# Patient Record
Sex: Female | Born: 1960 | Race: White | Hispanic: No | Marital: Married | Smoking: Never smoker
Health system: Southern US, Community
[De-identification: ages and names within clinical notes are randomized; demographics above are authoritative.]

## PROBLEM LIST (undated history)

## (undated) DIAGNOSIS — M1990 Unspecified osteoarthritis, unspecified site: Secondary | ICD-10-CM

## (undated) DIAGNOSIS — M797 Fibromyalgia: Secondary | ICD-10-CM

## (undated) DIAGNOSIS — I719 Aortic aneurysm of unspecified site, without rupture: Secondary | ICD-10-CM

## (undated) DIAGNOSIS — K3184 Gastroparesis: Secondary | ICD-10-CM

## (undated) DIAGNOSIS — E8849 Other mitochondrial metabolism disorders: Secondary | ICD-10-CM

## (undated) DIAGNOSIS — E119 Type 2 diabetes mellitus without complications: Secondary | ICD-10-CM

## (undated) DIAGNOSIS — R519 Headache, unspecified: Secondary | ICD-10-CM

## (undated) DIAGNOSIS — J449 Chronic obstructive pulmonary disease, unspecified: Secondary | ICD-10-CM

## (undated) DIAGNOSIS — I10 Essential (primary) hypertension: Secondary | ICD-10-CM

## (undated) DIAGNOSIS — G7000 Myasthenia gravis without (acute) exacerbation: Secondary | ICD-10-CM

## (undated) DIAGNOSIS — E079 Disorder of thyroid, unspecified: Secondary | ICD-10-CM

## (undated) DIAGNOSIS — T8859XA Other complications of anesthesia, initial encounter: Secondary | ICD-10-CM

## (undated) DIAGNOSIS — E039 Hypothyroidism, unspecified: Secondary | ICD-10-CM

## (undated) DIAGNOSIS — G4730 Sleep apnea, unspecified: Secondary | ICD-10-CM

## (undated) DIAGNOSIS — E559 Vitamin D deficiency, unspecified: Secondary | ICD-10-CM

## (undated) DIAGNOSIS — J45909 Unspecified asthma, uncomplicated: Secondary | ICD-10-CM

## (undated) DIAGNOSIS — D649 Anemia, unspecified: Secondary | ICD-10-CM

## (undated) HISTORY — PX: TONSILLECTOMY: SUR1361

## (undated) HISTORY — PX: ABDOMINAL HYSTERECTOMY: SHX81

## (undated) HISTORY — PX: APPENDECTOMY: SHX54

## (undated) HISTORY — PX: SPINE SURGERY: SHX786

## (undated) HISTORY — PX: CHOLECYSTECTOMY: SHX55

## (undated) HISTORY — PX: CARDIAC CATHETERIZATION: SHX172

---

## 2014-06-10 ENCOUNTER — Ambulatory Visit: Payer: Self-pay | Admitting: Internal Medicine

## 2015-05-06 ENCOUNTER — Other Ambulatory Visit: Payer: Self-pay | Admitting: Internal Medicine

## 2015-05-06 DIAGNOSIS — Z1231 Encounter for screening mammogram for malignant neoplasm of breast: Secondary | ICD-10-CM

## 2015-06-11 ENCOUNTER — Ambulatory Visit: Payer: Self-pay

## 2015-09-17 ENCOUNTER — Emergency Department
Admission: EM | Admit: 2015-09-17 | Discharge: 2015-09-17 | Disposition: A | Discharge status: Home / Self Care | Payer: Medicare Other | Attending: Emergency Medicine | Admitting: Emergency Medicine

## 2015-09-17 ENCOUNTER — Encounter: Payer: Self-pay | Admitting: Emergency Medicine

## 2015-09-17 DIAGNOSIS — I10 Essential (primary) hypertension: Secondary | ICD-10-CM | POA: Insufficient documentation

## 2015-09-17 DIAGNOSIS — R112 Nausea with vomiting, unspecified: Secondary | ICD-10-CM | POA: Diagnosis present

## 2015-09-17 DIAGNOSIS — J45909 Unspecified asthma, uncomplicated: Secondary | ICD-10-CM | POA: Diagnosis not present

## 2015-09-17 DIAGNOSIS — M1990 Unspecified osteoarthritis, unspecified site: Secondary | ICD-10-CM | POA: Diagnosis not present

## 2015-09-17 DIAGNOSIS — K3184 Gastroparesis: Secondary | ICD-10-CM

## 2015-09-17 HISTORY — DX: Essential (primary) hypertension: I10

## 2015-09-17 HISTORY — DX: Fibromyalgia: M79.7

## 2015-09-17 HISTORY — DX: Gastroparesis: K31.84

## 2015-09-17 HISTORY — DX: Sleep apnea, unspecified: G47.30

## 2015-09-17 HISTORY — DX: Unspecified osteoarthritis, unspecified site: M19.90

## 2015-09-17 HISTORY — DX: Unspecified asthma, uncomplicated: J45.909

## 2015-09-17 HISTORY — DX: Disorder of thyroid, unspecified: E07.9

## 2015-09-17 LAB — COMPREHENSIVE METABOLIC PANEL
ALT: 16 U/L (ref 14–54)
ANION GAP: 11 (ref 5–15)
AST: 32 U/L (ref 15–41)
Albumin: 4.5 g/dL (ref 3.5–5.0)
Alkaline Phosphatase: 79 U/L (ref 38–126)
BUN: 9 mg/dL (ref 6–20)
CHLORIDE: 102 mmol/L (ref 101–111)
CO2: 27 mmol/L (ref 22–32)
Calcium: 9.7 mg/dL (ref 8.9–10.3)
Creatinine, Ser: 1.08 mg/dL — ABNORMAL HIGH (ref 0.44–1.00)
GFR, EST NON AFRICAN AMERICAN: 56 mL/min — AB (ref >60)
Glucose, Bld: 108 mg/dL — ABNORMAL HIGH (ref 65–99)
POTASSIUM: 4 mmol/L (ref 3.5–5.1)
Sodium: 140 mmol/L (ref 135–145)
Total Bilirubin: 0.5 mg/dL (ref 0.3–1.2)
Total Protein: 7.3 g/dL (ref 6.5–8.1)

## 2015-09-17 LAB — TROPONIN I

## 2015-09-17 LAB — CBC
HCT: 38 % (ref 35.0–47.0)
Hemoglobin: 12.7 g/dL (ref 12.0–16.0)
MCH: 31.2 pg (ref 26.0–34.0)
MCHC: 33.4 g/dL (ref 32.0–36.0)
MCV: 93.3 fL (ref 80.0–100.0)
PLATELETS: 230 10*3/uL (ref 150–440)
RBC: 4.07 MIL/uL (ref 3.80–5.20)
RDW: 13.5 % (ref 11.5–14.5)
WBC: 8.2 10*3/uL (ref 3.6–11.0)

## 2015-09-17 LAB — LIPASE, BLOOD: LIPASE: 26 U/L (ref 11–51)

## 2015-09-17 MED ORDER — SODIUM CHLORIDE 0.9 % IV SOLN
1000.0000 mL | Freq: Once | INTRAVENOUS | Status: AC
  Administered 2015-09-17: 1000 mL via INTRAVENOUS

## 2015-09-17 MED ORDER — SODIUM CHLORIDE 0.9 % IV SOLN
8.0000 mg | Freq: Once | INTRAVENOUS | Status: AC
Start: 2015-09-17 — End: 2015-09-17
  Administered 2015-09-17: 8 mg via INTRAVENOUS
  Filled 2015-09-17: qty 4

## 2015-09-17 MED ORDER — GI COCKTAIL ~~LOC~~
30.0000 mL | Freq: Once | ORAL | Status: AC
  Administered 2015-09-17: 30 mL via ORAL
  Filled 2015-09-17: qty 30

## 2015-09-17 NOTE — ED Provider Notes (Signed)
[facility] Emergency Department Provider Note  ____________________________________________    I have reviewed the triage vital signs and the nursing notes.   HISTORY  Chief Complaint Abdominal Pain and Nausea    HPI Betty Peterson is a 55 y.o. female who presents with complaints of abdominal pain in the epigastrium which is cramping and burning in nature as well as nausea and vomiting. She reports she is not tolerating by mouth's. She reports this is consistent with a gastroparesis flare for her. She has a long history of gastroparesis and takes Compazine, Reglan, Zofran and Phenergan and recently her insurance company stopped paying for Phenergan which she feels is responsible for her flare     Past Medical History  Diagnosis Date  . Gastroparesis   . Arthritis   . Asthma   . Fibromyalgia   . Thyroid disease   . Hypertension   . Sleep apnea     There are no active problems to display for this patient.   Past Surgical History  Procedure Laterality Date  . Appendectomy    . Abdominal hysterectomy    . Cholecystectomy    . Tonsillectomy      No current outpatient prescriptions on file.  Allergies Ambien; Avelox; Butorphanol tartrate; Carafate; Morphine and related; Prednisone; Simvastatin; Tape; Tetracyclines & related; and Biaxin  No family history on file.  Social History Social History  Substance Use Topics  . Smoking status: Never Smoker   . Smokeless tobacco: None  . Alcohol Use: No    Review of Systems  Constitutional: Negative for fever. Eyes: Negative for redness ENT: Negative for sore throat Cardiovascular: Negative for chest pain Respiratory: No cough Gastrointestinal: As above Genitourinary: Negative for dysuria. Musculoskeletal: Negative for back pain. Skin: Negative for rash. Neurological: Negative for focal weakness Psychiatric: no anxiety    ____________________________________________   PHYSICAL  EXAM:  VITAL SIGNS: ED Triage Vitals  Enc Vitals Group     BP 09/17/15 1505 157/102 mmHg     Pulse Rate 09/17/15 1505 92     Resp 09/17/15 1505 18     Temp 09/17/15 1505 98.6 F (37 C)     Temp Source 09/17/15 1505 Oral     SpO2 09/17/15 1505 95 %     Weight 09/17/15 1505 209 lb (94.802 kg)     Height 09/17/15 1505 5\' 7"  (1.702 m)     Head Cir --      Peak Flow --      Pain Score 09/17/15 1516 10     Pain Loc --      Pain Edu? --      Excl. in [place]? --      Constitutional: Alert and oriented. Well appearing and in no distress.  Eyes: Conjunctivae are normal. No erythema or injection ENT   Head: Normocephalic and atraumatic.   Mouth/Throat: Mucous membranes are moist. Cardiovascular: Normal rate, regular rhythm. Normal and symmetric distal pulses are present in the upper extremities Respiratory: Normal respiratory effort without tachypnea nor retractions. Breath sounds are clear and equal bilaterally.  Gastrointestinal: Mild tenderness in epigastrium. No distention. There is no CVA tenderness. Genitourinary: deferred Musculoskeletal: Nontender with normal range of motion in all extremities. No lower extremity tenderness nor edema. Neurologic:  Normal speech and language. No gross focal neurologic deficits are appreciated. Skin:  Skin is warm, dry and intact. No rash noted. Psychiatric: Mood and affect are normal. Patient exhibits appropriate insight and judgment.  ____________________________________________    LABS (pertinent  positives/negatives)  Labs Reviewed  COMPREHENSIVE METABOLIC PANEL - Abnormal; Notable for the following:    Glucose, Bld 108 (*)    Creatinine, Ser 1.08 (*)    GFR calc non Af Amer 56 (*)    All other components within normal limits  CBC  LIPASE, BLOOD  TROPONIN I    ____________________________________________   EKG  ED ECG REPORT I, Lavonia Drafts, the attending physician, personally viewed and interpreted this ECG.  Date:  09/17/2015 EKG Time: 4:05 PM Rate: 78 Rhythm: normal sinus rhythm QRS Axis: normal Intervals: Prolonged QT interval ST/T Wave abnormalities: normal Conduction Disturbances: none    ____________________________________________    RADIOLOGY  None  ____________________________________________   PROCEDURES  Procedure(s) performed: none  Critical Care performed: none  ____________________________________________   INITIAL IMPRESSION / ASSESSMENT AND PLAN / ED COURSE  Pertinent labs & imaging results that were available during my care of the patient were reviewed by me and considered in my medical decision making (see chart for details).   Patient persist with nausea and vomiting with a history of gastroparesis. She reports this feels exactly like a gastroparesis flare she has had in the past. She states typically IV fluids and Zofran helps her feel better. We will treat with a milligrams of IV Zofran, normal saline and reevaluate   ----------------------------------------- 6:01 PM on 09/17/2015 -----------------------------------------  Patient feeling much better, complaining of mild burning in her esophagus/pharynx we will give GI cocktail and discharge with pcp f/u  ____________________________________________   FINAL CLINICAL IMPRESSION(S) / ED DIAGNOSES  Final diagnoses:  Nondiabetic gastroparesis          Lavonia Drafts, MD 09/17/15 [phone]

## 2015-09-17 NOTE — ED Notes (Signed)
Pt presents to ED with abdominal pain and nausea. Pt states was recently treated with levaquin for a sinus infection and iron supplement which she thought was causing the nausea so she has stopped those but that has not helped. Pt states has other medications that she takes nausea but her insurance company will not cover the phenergan anymore so she has not been able to take it. Pt states she is on compazine, reglan and zofran. Pt states she has end stage gastroparesis.

## 2015-09-19 ENCOUNTER — Observation Stay
Admission: EM | Admit: 2015-09-19 | Discharge: 2015-09-22 | Disposition: A | Discharge status: Home / Self Care | Payer: Medicare Other | Attending: Internal Medicine | Admitting: Internal Medicine

## 2015-09-19 ENCOUNTER — Encounter: Payer: Self-pay | Admitting: Internal Medicine

## 2015-09-19 DIAGNOSIS — J45909 Unspecified asthma, uncomplicated: Secondary | ICD-10-CM | POA: Diagnosis not present

## 2015-09-19 DIAGNOSIS — Z885 Allergy status to narcotic agent status: Secondary | ICD-10-CM | POA: Diagnosis not present

## 2015-09-19 DIAGNOSIS — Z9049 Acquired absence of other specified parts of digestive tract: Secondary | ICD-10-CM | POA: Insufficient documentation

## 2015-09-19 DIAGNOSIS — J329 Chronic sinusitis, unspecified: Secondary | ICD-10-CM | POA: Diagnosis not present

## 2015-09-19 DIAGNOSIS — G4730 Sleep apnea, unspecified: Secondary | ICD-10-CM | POA: Insufficient documentation

## 2015-09-19 DIAGNOSIS — M1990 Unspecified osteoarthritis, unspecified site: Secondary | ICD-10-CM | POA: Insufficient documentation

## 2015-09-19 DIAGNOSIS — R1110 Vomiting, unspecified: Secondary | ICD-10-CM

## 2015-09-19 DIAGNOSIS — I10 Essential (primary) hypertension: Secondary | ICD-10-CM | POA: Insufficient documentation

## 2015-09-19 DIAGNOSIS — Z833 Family history of diabetes mellitus: Secondary | ICD-10-CM | POA: Insufficient documentation

## 2015-09-19 DIAGNOSIS — Z8249 Family history of ischemic heart disease and other diseases of the circulatory system: Secondary | ICD-10-CM | POA: Insufficient documentation

## 2015-09-19 DIAGNOSIS — Z881 Allergy status to other antibiotic agents status: Secondary | ICD-10-CM | POA: Diagnosis not present

## 2015-09-19 DIAGNOSIS — E039 Hypothyroidism, unspecified: Secondary | ICD-10-CM | POA: Insufficient documentation

## 2015-09-19 DIAGNOSIS — Z90710 Acquired absence of both cervix and uterus: Secondary | ICD-10-CM | POA: Insufficient documentation

## 2015-09-19 DIAGNOSIS — E785 Hyperlipidemia, unspecified: Secondary | ICD-10-CM | POA: Diagnosis not present

## 2015-09-19 DIAGNOSIS — Z91048 Other nonmedicinal substance allergy status: Secondary | ICD-10-CM | POA: Insufficient documentation

## 2015-09-19 DIAGNOSIS — K3184 Gastroparesis: Principal | ICD-10-CM | POA: Insufficient documentation

## 2015-09-19 DIAGNOSIS — Z79899 Other long term (current) drug therapy: Secondary | ICD-10-CM | POA: Insufficient documentation

## 2015-09-19 DIAGNOSIS — M797 Fibromyalgia: Secondary | ICD-10-CM | POA: Diagnosis not present

## 2015-09-19 DIAGNOSIS — Z888 Allergy status to other drugs, medicaments and biological substances status: Secondary | ICD-10-CM | POA: Insufficient documentation

## 2015-09-19 LAB — TROPONIN I

## 2015-09-19 LAB — COMPREHENSIVE METABOLIC PANEL
ALK PHOS: 79 U/L (ref 38–126)
ALT: 23 U/L (ref 14–54)
AST: 77 U/L — ABNORMAL HIGH (ref 15–41)
Albumin: 4.4 g/dL (ref 3.5–5.0)
Anion gap: 11 (ref 5–15)
BUN: 12 mg/dL (ref 6–20)
CALCIUM: 9.8 mg/dL (ref 8.9–10.3)
CO2: 25 mmol/L (ref 22–32)
CREATININE: 1.01 mg/dL — AB (ref 0.44–1.00)
Chloride: 101 mmol/L (ref 101–111)
Glucose, Bld: 101 mg/dL — ABNORMAL HIGH (ref 65–99)
Potassium: 4 mmol/L (ref 3.5–5.1)
Sodium: 137 mmol/L (ref 135–145)
Total Bilirubin: 0.3 mg/dL (ref 0.3–1.2)
Total Protein: 7.2 g/dL (ref 6.5–8.1)

## 2015-09-19 LAB — CBC
HCT: 37.9 % (ref 35.0–47.0)
HEMOGLOBIN: 12.8 g/dL (ref 12.0–16.0)
MCH: 31.5 pg (ref 26.0–34.0)
MCHC: 33.8 g/dL (ref 32.0–36.0)
MCV: 93.1 fL (ref 80.0–100.0)
Platelets: 234 10*3/uL (ref 150–440)
RBC: 4.07 MIL/uL (ref 3.80–5.20)
RDW: 12.8 % (ref 11.5–14.5)
WBC: 10.8 10*3/uL (ref 3.6–11.0)

## 2015-09-19 LAB — GLUCOSE, CAPILLARY
Glucose-Capillary: 84 mg/dL (ref 65–99)
Glucose-Capillary: 137 mg/dL — ABNORMAL HIGH (ref 65–99)

## 2015-09-19 LAB — LIPASE, BLOOD: LIPASE: 24 U/L (ref 11–51)

## 2015-09-19 MED ORDER — SODIUM CHLORIDE 0.9 % IV SOLN
1000.0000 mL | Freq: Once | INTRAVENOUS | Status: AC
  Administered 2015-09-19: 1000 mL via INTRAVENOUS

## 2015-09-19 MED ORDER — MAGNESIUM OXIDE 400 (241.3 MG) MG PO TABS
400.0000 mg | ORAL_TABLET | Freq: Three times a day (TID) | ORAL | Status: DC
  Administered 2015-09-19 – 2015-09-22 (×8): 400 mg via ORAL
  Filled 2015-09-19 (×8): qty 1

## 2015-09-19 MED ORDER — OXYCODONE-ACETAMINOPHEN 5-325 MG PO TABS
1.0000 | ORAL_TABLET | Freq: Four times a day (QID) | ORAL | Status: DC | PRN
  Administered 2015-09-19 – 2015-09-21 (×4): 1 via ORAL
  Filled 2015-09-19 (×4): qty 1

## 2015-09-19 MED ORDER — METOCLOPRAMIDE HCL 5 MG/ML IJ SOLN
10.0000 mg | Freq: Once | INTRAMUSCULAR | Status: AC
  Administered 2015-09-19: 10 mg via INTRAVENOUS
  Filled 2015-09-19: qty 2

## 2015-09-19 MED ORDER — SODIUM CHLORIDE 0.9 % IV SOLN
INTRAVENOUS | Status: DC
  Administered 2015-09-19 – 2015-09-20 (×2): via INTRAVENOUS

## 2015-09-19 MED ORDER — BISACODYL 5 MG PO TBEC
15.0000 mg | DELAYED_RELEASE_TABLET | Freq: Every day | ORAL | Status: DC
  Administered 2015-09-19 – 2015-09-21 (×3): 15 mg via ORAL
  Filled 2015-09-19 (×4): qty 3

## 2015-09-19 MED ORDER — OMEGA-3-ACID ETHYL ESTERS 1 G PO CAPS
1000.0000 mg | ORAL_CAPSULE | Freq: Three times a day (TID) | ORAL | Status: DC
  Administered 2015-09-19 – 2015-09-22 (×8): 1000 mg via ORAL
  Filled 2015-09-19 (×8): qty 1

## 2015-09-19 MED ORDER — TIZANIDINE HCL 4 MG PO TABS
6.0000 mg | ORAL_TABLET | Freq: Three times a day (TID) | ORAL | Status: DC
  Administered 2015-09-19 – 2015-09-22 (×8): 6 mg via ORAL
  Filled 2015-09-19 (×8): qty 2

## 2015-09-19 MED ORDER — POTASSIUM CHLORIDE CRYS ER 10 MEQ PO TBCR
10.0000 meq | EXTENDED_RELEASE_TABLET | Freq: Two times a day (BID) | ORAL | Status: DC
  Administered 2015-09-19 – 2015-09-22 (×6): 10 meq via ORAL
  Filled 2015-09-19 (×10): qty 1

## 2015-09-19 MED ORDER — TRAMADOL HCL 50 MG PO TABS
50.0000 mg | ORAL_TABLET | ORAL | Status: DC | PRN
  Administered 2015-09-19 – 2015-09-22 (×5): 50 mg via ORAL
  Filled 2015-09-19 (×5): qty 1

## 2015-09-19 MED ORDER — POLYSACCHARIDE IRON COMPLEX 150 MG PO CAPS
150.0000 mg | ORAL_CAPSULE | Freq: Every day | ORAL | Status: DC
  Administered 2015-09-20: 150 mg via ORAL
  Filled 2015-09-19 (×2): qty 1

## 2015-09-19 MED ORDER — DIPHENHYDRAMINE HCL 25 MG PO TABS
50.0000 mg | ORAL_TABLET | ORAL | Status: DC | PRN
  Filled 2015-09-19: qty 2

## 2015-09-19 MED ORDER — DIPHENHYDRAMINE HCL 25 MG PO CAPS
25.0000 mg | ORAL_CAPSULE | Freq: Four times a day (QID) | ORAL | Status: DC
  Administered 2015-09-19 – 2015-09-20 (×6): 25 mg via ORAL
  Filled 2015-09-19 (×13): qty 1

## 2015-09-19 MED ORDER — PRAVASTATIN SODIUM 20 MG PO TABS
20.0000 mg | ORAL_TABLET | Freq: Every day | ORAL | Status: DC
  Administered 2015-09-19 – 2015-09-21 (×3): 20 mg via ORAL
  Filled 2015-09-19 (×3): qty 1

## 2015-09-19 MED ORDER — LORATADINE 10 MG PO TABS
10.0000 mg | ORAL_TABLET | Freq: Every day | ORAL | Status: DC
  Administered 2015-09-19 – 2015-09-22 (×4): 10 mg via ORAL
  Filled 2015-09-19 (×4): qty 1

## 2015-09-19 MED ORDER — BISOPROLOL FUMARATE 10 MG PO TABS
10.0000 mg | ORAL_TABLET | Freq: Every day | ORAL | Status: DC
  Administered 2015-09-19 – 2015-09-22 (×4): 10 mg via ORAL
  Filled 2015-09-19 (×4): qty 1

## 2015-09-19 MED ORDER — ONDANSETRON HCL 4 MG/2ML IJ SOLN
INTRAMUSCULAR | Status: AC
  Administered 2015-09-19: 4 mg via INTRAVENOUS
  Filled 2015-09-19: qty 2

## 2015-09-19 MED ORDER — VITAMIN B-12 1000 MCG PO TABS
1000.0000 ug | ORAL_TABLET | Freq: Every day | ORAL | Status: DC
  Administered 2015-09-20 – 2015-09-22 (×3): 1000 ug via ORAL
  Filled 2015-09-19 (×3): qty 1

## 2015-09-19 MED ORDER — PANTOPRAZOLE SODIUM 40 MG PO TBEC
40.0000 mg | DELAYED_RELEASE_TABLET | Freq: Every day | ORAL | Status: DC
  Administered 2015-09-19 – 2015-09-22 (×4): 40 mg via ORAL
  Filled 2015-09-19 (×4): qty 1

## 2015-09-19 MED ORDER — HEPARIN SODIUM (PORCINE) 5000 UNIT/ML IJ SOLN
5000.0000 [IU] | Freq: Three times a day (TID) | INTRAMUSCULAR | Status: DC
  Administered 2015-09-19 – 2015-09-20 (×2): 5000 [IU] via SUBCUTANEOUS
  Filled 2015-09-19 (×2): qty 1

## 2015-09-19 MED ORDER — POLYETHYLENE GLYCOL 3350 17 G PO PACK
17.0000 g | PACK | Freq: Every day | ORAL | Status: DC
  Administered 2015-09-19 – 2015-09-21 (×3): 17 g via ORAL
  Filled 2015-09-19 (×3): qty 1

## 2015-09-19 MED ORDER — METOCLOPRAMIDE HCL 5 MG/ML IJ SOLN
INTRAMUSCULAR | Status: AC
  Administered 2015-09-19: 10 mg via INTRAVENOUS
  Filled 2015-09-19: qty 2

## 2015-09-19 MED ORDER — INSULIN ASPART 100 UNIT/ML ~~LOC~~ SOLN: 0.0000 [IU] | Freq: Three times a day (TID) | SUBCUTANEOUS | Status: DC

## 2015-09-19 MED ORDER — FOLIC ACID 1 MG PO TABS
1.0000 mg | ORAL_TABLET | Freq: Every day | ORAL | Status: DC
  Administered 2015-09-20 – 2015-09-22 (×3): 1 mg via ORAL
  Filled 2015-09-19 (×3): qty 1

## 2015-09-19 MED ORDER — ONDANSETRON HCL 4 MG/2ML IJ SOLN
4.0000 mg | Freq: Four times a day (QID) | INTRAMUSCULAR | Status: DC | PRN
  Administered 2015-09-19 – 2015-09-20 (×3): 4 mg via INTRAVENOUS
  Filled 2015-09-19 (×2): qty 2

## 2015-09-19 MED ORDER — COQ10 200 MG PO CAPS: 200.0000 mg | ORAL_CAPSULE | Freq: Every day | ORAL | Status: DC

## 2015-09-19 MED ORDER — LEVOTHYROXINE SODIUM 150 MCG PO TABS
150.0000 ug | ORAL_TABLET | Freq: Every day | ORAL | Status: DC
  Administered 2015-09-20 – 2015-09-22 (×3): 150 ug via ORAL
  Filled 2015-09-19 (×3): qty 1

## 2015-09-19 MED ORDER — MIRTAZAPINE 15 MG PO TABS
30.0000 mg | ORAL_TABLET | Freq: Every day | ORAL | Status: DC
  Filled 2015-09-19: qty 2

## 2015-09-19 MED ORDER — FLUTICASONE PROPIONATE 50 MCG/ACT NA SUSP
2.0000 | Freq: Every day | NASAL | Status: DC
  Administered 2015-09-19 – 2015-09-21 (×3): 2 via NASAL
  Filled 2015-09-19 (×2): qty 16

## 2015-09-19 MED ORDER — VITAMIN D 1000 UNITS PO TABS
1000.0000 [IU] | ORAL_TABLET | Freq: Every day | ORAL | Status: DC
  Administered 2015-09-20 – 2015-09-22 (×3): 1000 [IU] via ORAL
  Filled 2015-09-19 (×3): qty 1

## 2015-09-19 MED ORDER — METOCLOPRAMIDE HCL 10 MG PO TABS
10.0000 mg | ORAL_TABLET | Freq: Four times a day (QID) | ORAL | Status: DC
  Administered 2015-09-19 – 2015-09-21 (×3): 10 mg via ORAL
  Filled 2015-09-19 (×4): qty 1

## 2015-09-19 MED ORDER — LOSARTAN POTASSIUM 50 MG PO TABS
100.0000 mg | ORAL_TABLET | Freq: Every day | ORAL | Status: DC
  Administered 2015-09-19 – 2015-09-22 (×4): 100 mg via ORAL
  Filled 2015-09-19 (×4): qty 2

## 2015-09-19 MED ORDER — ACETAMINOPHEN 500 MG PO TABS: 500.0000 mg | ORAL_TABLET | ORAL | Status: DC | PRN

## 2015-09-19 MED ORDER — METOCLOPRAMIDE HCL 5 MG/ML IJ SOLN
10.0000 mg | Freq: Three times a day (TID) | INTRAMUSCULAR | Status: DC | PRN
  Administered 2015-09-19: 10 mg via INTRAVENOUS

## 2015-09-19 MED ORDER — MELATONIN 5 MG PO TABS
10.0000 mg | ORAL_TABLET | Freq: Every day | ORAL | Status: DC
  Administered 2015-09-19 – 2015-09-21 (×3): 10 mg via ORAL
  Filled 2015-09-19 (×3): qty 2

## 2015-09-19 MED ORDER — LEVOCETIRIZINE DIHYDROCHLORIDE 5 MG PO TABS: 5.0000 mg | ORAL_TABLET | Freq: Every evening | ORAL | Status: DC

## 2015-09-19 MED ORDER — ADULT MULTIVITAMIN W/MINERALS CH
1.0000 | ORAL_TABLET | Freq: Every day | ORAL | Status: DC
  Administered 2015-09-20 – 2015-09-22 (×3): 1 via ORAL
  Filled 2015-09-19 (×3): qty 1

## 2015-09-19 MED ORDER — BUMETANIDE 1 MG PO TABS
1.0000 mg | ORAL_TABLET | Freq: Every day | ORAL | Status: DC
  Administered 2015-09-20 – 2015-09-22 (×3): 1 mg via ORAL
  Filled 2015-09-19 (×5): qty 1

## 2015-09-19 MED ORDER — ONDANSETRON HCL 4 MG/2ML IJ SOLN
4.0000 mg | Freq: Once | INTRAMUSCULAR | Status: AC
  Administered 2015-09-19: 4 mg via INTRAVENOUS
  Filled 2015-09-19: qty 2

## 2015-09-19 MED ORDER — IBUPROFEN 800 MG PO TABS
800.0000 mg | ORAL_TABLET | Freq: Three times a day (TID) | ORAL | Status: DC | PRN
  Administered 2015-09-20: 800 mg via ORAL
  Filled 2015-09-19 (×2): qty 1

## 2015-09-19 MED ORDER — OXYCODONE-ACETAMINOPHEN 5-325 MG PO TABS
ORAL_TABLET | ORAL | Status: AC
  Administered 2015-09-19: 1 via ORAL
  Filled 2015-09-19: qty 1

## 2015-09-19 MED ORDER — LEVOFLOXACIN 500 MG PO TABS
500.0000 mg | ORAL_TABLET | Freq: Every day | ORAL | Status: DC
  Administered 2015-09-20 – 2015-09-22 (×3): 500 mg via ORAL
  Filled 2015-09-19 (×3): qty 1

## 2015-09-19 NOTE — ED Notes (Signed)
Seen Wednesday for gastroparesis treated for nausea. Pt states she started vomiting this morning multiple times pt also c/o upper abd pain. Has had 2 loose stools today takes miralax

## 2015-09-19 NOTE — ED Notes (Signed)
Pt states usually takes a "cocktail" of GI meds: zofran, phenergan, compazine, and reglan and that her insurance has recently made the compazine and phenergan too expensive.

## 2015-09-19 NOTE — ED Provider Notes (Signed)
[facility] Emergency Department Provider Note  ____________________________________________    I have reviewed the triage vital signs and the nursing notes.   HISTORY  Chief Complaint Abdominal Pain and Emesis    HPI Betty Peterson is a 55 y.o. female who presents with complaints of epigastric, pain, and nausea and vomiting. Patient was seen by me yesterday and had felt immediately better after treatment with IV Zofran and fluids. She reports she began vomiting again this morning. She has a long history of gastroparesis and typically takes Compazine Reglan Zofran and Phenergan daily. She denies chest pain. No shortness of breath. No fevers or chills.     Past Medical History  Diagnosis Date  . Gastroparesis   . Arthritis   . Asthma   . Fibromyalgia   . Thyroid disease   . Hypertension   . Sleep apnea     There are no active problems to display for this patient.   Past Surgical History  Procedure Laterality Date  . Appendectomy    . Abdominal hysterectomy    . Cholecystectomy    . Tonsillectomy      No current outpatient prescriptions on file.  Allergies Ambien; Avelox; Butorphanol tartrate; Carafate; Morphine and related; Prednisone; Simvastatin; Tape; Tetracyclines & related; and Biaxin  No family history on file.  Social History Social History  Substance Use Topics  . Smoking status: Never Smoker   . Smokeless tobacco: Not on file  . Alcohol Use: No    Review of Systems  Constitutional: Negative for fever.   Cardiovascular: Negative for chest pain Respiratory: Negative for shortness of breath. Gastrointestinal: As above Genitourinary: Negative for dysuria.  Skin: Negative for rash. Neurological: Negative for focal weakness Psychiatric: no anxiety  10 point review of systems otherwise negative  ____________________________________________   PHYSICAL EXAM:  VITAL SIGNS: ED Triage Vitals  Enc Vitals Group      BP 09/19/15 1321 180/96 mmHg     Pulse Rate 09/19/15 1321 88     Resp 09/19/15 1321 20     Temp 09/19/15 1321 98.6 F (37 C)     Temp Source 09/19/15 1321 Oral     SpO2 09/19/15 1321 98 %     Weight 09/19/15 1321 207 lb (93.895 kg)     Height 09/19/15 1321 5\' 7"  (1.702 m)     Head Cir --      Peak Flow --      Pain Score 09/19/15 1322 10     Pain Loc --      Pain Edu? --      Excl. in [place]? --      Constitutional: Alert and oriented. Well appearing and in no distress.  Eyes: Conjunctivae are normal. No erythema or injection ENT   Head: Normocephalic and atraumatic.   Mouth/Throat: Mucous membranes are moist. Cardiovascular: Normal rate, regular rhythm. Normal and symmetric distal pulses are present in the upper extremities.  Respiratory: Normal respiratory effort without tachypnea nor retractions. Breath sounds are clear and equal bilaterally.  Gastrointestinal: Mild tenderness in the epigastrium. No distention. There is no CVA tenderness. Genitourinary: deferred Musculoskeletal: Nontender with normal range of motion in all extremities. No lower extremity tenderness nor edema. Neurologic:  Normal speech and language. No gross focal neurologic deficits are appreciated. Skin:  Skin is warm, dry and intact. No rash noted. Psychiatric: Mood and affect are normal. Patient exhibits appropriate insight and judgment.  ____________________________________________    LABS (pertinent positives/negatives)  Labs Reviewed  CBC  COMPREHENSIVE METABOLIC PANEL  LIPASE, BLOOD    ____________________________________________   EKG  ED ECG REPORT I, Lavonia Drafts, the attending physician, personally viewed and interpreted this ECG.  Date: 09/19/2015 EKG Time: 1:25 PM Rate: 76 Rhythm: normal sinus rhythm QRS Axis: normal Intervals: normal ST/T Wave abnormalities: He wave inversions V3 V4 Conduction Disturbances: none    ____________________________________________     RADIOLOGY  None  ____________________________________________   PROCEDURES  Procedure(s) performed: none  Critical Care performed: none  ____________________________________________   INITIAL IMPRESSION / ASSESSMENT AND PLAN / ED COURSE  Pertinent labs & imaging results that were available during my care of the patient were reviewed by me and considered in my medical decision making (see chart for details).  Patient presents with symptoms of gastroparesis flare. We attempt at home treatment yesterday but we were unsuccessful. Patient says she does well with both Reglan and Zofran IV so we will try those medications today with a liter of normal saline and reevaluate.  Patient continues to feel ill and vomit. We will admit to the hospital for further management  ____________________________________________   FINAL CLINICAL IMPRESSION(S) / ED DIAGNOSES  Final diagnoses:  Intractable vomiting with nausea, vomiting of unspecified type          Lavonia Drafts, MD 09/19/15 1630

## 2015-09-19 NOTE — H&P (Signed)
[facility] [place] NAME: Betty Peterson    MR#:  [id number]  DATE OF BIRTH:  May 14, 1960  DATE OF ADMISSION:  09/19/2015  PRIMARY CARE PHYSICIAN: Tracie Harrier, MD   REQUESTING/REFERRING PHYSICIAN: [place]  CHIEF COMPLAINT:   Chief Complaint  Patient presents with  . Abdominal Pain  . Emesis    HISTORY OF PRESENT ILLNESS: Betty Peterson  is a 55 y.o. female with a known history of Gastroperesis, hypertension, Sleep apnea, Fibromyalgia- had vomiting episodes for 2 days- and oral meds are not helping- came to ER- given IV inj- felt better- sent home. Again starte vomiting, and abd pain, so came back. She could not tolerate oral diet for her oral medications for last 2 days.  PAST MEDICAL HISTORY:   Past Medical History  Diagnosis Date  . Gastroparesis   . Arthritis   . Asthma   . Fibromyalgia   . Thyroid disease   . Hypertension   . Sleep apnea     PAST SURGICAL HISTORY:  Past Surgical History  Procedure Laterality Date  . Appendectomy    . Abdominal hysterectomy    . Cholecystectomy    . Tonsillectomy      SOCIAL HISTORY:  Social History  Substance Use Topics  . Smoking status: Never Smoker   . Smokeless tobacco: Not on file  . Alcohol Use: No    FAMILY HISTORY:  Family History  Problem Relation Age of Onset  . Diabetes Mother   . Hypertension Mother   . CAD Mother   . Hypertension Father   . CAD Father   . CAD Brother     DRUG ALLERGIES:  Allergies  Allergen Reactions  . Ambien [Zolpidem Tartrate] Other (See Comments)    hallucinations  . Avelox [Moxifloxacin Hcl In Nacl] Hives  . 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  . Butorphanol Tartrate Other (See Comments)  . Carafate [Sucralfate] Hives  . Morphine And Related Nausea And Vomiting  . Prednisone Nausea And Vomiting  . Simvastatin Other (See Comments)    Muscle pain  . Tape Other (See  Comments)    blisters  . Tetracyclines & Related Nausea And Vomiting  . Biaxin [Clarithromycin] Palpitations    REVIEW OF SYSTEMS:   CONSTITUTIONAL: No fever, fatigue or weakness.  EYES: No blurred or double vision.  EARS, NOSE, AND THROAT: No tinnitus or ear pain.  RESPIRATORY: No cough, shortness of breath, wheezing or hemoptysis.  CARDIOVASCULAR: No chest pain, orthopnea, edema.  GASTROINTESTINAL: positive   for nausea and vomiting with some abdominal pain, no diarrhea. GENITOURINARY: No dysuria, hematuria.  ENDOCRINE: No polyuria, nocturia,  HEMATOLOGY: No anemia, easy bruising or bleeding SKIN: No rash or lesion. MUSCULOSKELETAL: No joint pain or arthritis.   NEUROLOGIC: No tingling, numbness, weakness.  PSYCHIATRY: No anxiety or depression.   MEDICATIONS AT HOME:  Prior to Admission medications   Medication Sig Start Date End Date Taking? Authorizing Provider  levocetirizine (XYZAL) 5 MG tablet Take 5 mg by mouth every evening.   Yes Historical Provider, MD      PHYSICAL EXAMINATION:   VITAL SIGNS: Blood pressure 176/95, pulse 84, temperature 98.6 F (37 C), temperature source Oral, resp. rate 16, height 5\' 7"  (1.702 m), weight 93.895 kg (207 lb), SpO2 95 %.  GENERAL:  55 y.o.-year-old patient lying in the bed with no acute distress.  EYES: Pupils equal, round, reactive to light and accommodation. No scleral  icterus. Extraocular muscles intact.  HEENT: Head atraumatic, normocephalic. Oropharynx and nasopharynx clear.  NECK:  Supple, no jugular venous distention. No thyroid enlargement, no tenderness.  LUNGS: Normal breath sounds bilaterally, no wheezing, rales,rhonchi or crepitation. No use of accessory muscles of respiration.  CARDIOVASCULAR: S1, S2 normal. No murmurs, rubs, or gallops.  ABDOMEN: Soft, Mild epigastric tender, nondistended. Bowel sounds present. No organomegaly or mass.  EXTREMITIES: No pedal edema, cyanosis, or clubbing.  NEUROLOGIC: Cranial nerves II  through XII are intact. Muscle strength 5/5 in all extremities. Sensation intact. Gait not checked.  PSYCHIATRIC: The patient is alert and oriented x 3.  SKIN: No obvious rash, lesion, or ulcer.   LABORATORY PANEL:   CBC  Recent Labs Lab 09/17/15 1606 09/19/15 1400  WBC 8.2 10.8  HGB 12.7 12.8  HCT 38.0 37.9  PLT 230 234  MCV 93.3 93.1  MCH 31.2 31.5  MCHC 33.4 33.8  RDW 13.5 12.8   ------------------------------------------------------------------------------------------------------------------  Chemistries   Recent Labs Lab 09/17/15 1606 09/19/15 1400  NA 140 137  K 4.0 4.0  CL 102 101  CO2 27 25  GLUCOSE 108* 101*  BUN 9 12  CREATININE 1.08* 1.01*  CALCIUM 9.7 9.8  AST 32 77*  ALT 16 23  ALKPHOS 79 79  BILITOT 0.5 0.3   ------------------------------------------------------------------------------------------------------------------ estimated creatinine clearance is 73.1 mL/min (by C-G formula based on Cr of 1.01). ------------------------------------------------------------------------------------------------------------------ No results for input(s): TSH, T4TOTAL, T3FREE, THYROIDAB in the last 72 hours.  Invalid input(s): FREET3   Coagulation profile No results for input(s): INR, PROTIME in the last 168 hours. ------------------------------------------------------------------------------------------------------------------- No results for input(s): DDIMER in the last 72 hours. -------------------------------------------------------------------------------------------------------------------  Cardiac Enzymes  Recent Labs Lab 09/17/15 1606 09/19/15 1400  TROPONINI <0.03 <0.03   ------------------------------------------------------------------------------------------------------------------ Invalid input(s): POCBNP  ---------------------------------------------------------------------------------------------------------------  Urinalysis No  results found for: COLORURINE, APPEARANCEUR, LABSPEC, PHURINE, GLUCOSEU, HGBUR, BILIRUBINUR, KETONESUR, PROTEINUR, UROBILINOGEN, NITRITE, LEUKOCYTESUR   RADIOLOGY: No results found.  EKG: Orders placed or performed during the hospital encounter of 09/19/15  . EKG 12-Lead  . EKG 12-Lead    IMPRESSION AND PLAN:  * Intractable nausea and vomiting.   She had extensive workup done at [facility] in 1994 and she was diagnosed with gastroparesis.   Since then she is on multiple oral medications for nausea and help with gastric motility.   I will keep her on liquid diet and IV fluids, and give her IV Phenergan and Zofran.   As per patient, this episode is very similar to her gastroparesis episodes and there are no other symptoms, so I would hold on further workup at this point.  * Hypertension   Blood pressure is uncontrolled currently, as she could not take her meds for last 2 days.   I will give her oral medications for now.  * Fibromyalgia and pain.    continue tramadol, and Percocet for excessive pain.   * Sinusitis   She was started on levofloxacin by primary care physician 4 days ago, advised to take 10 days course.   I will continue that for here in hospital.    * Hypothyroidism    continue levothyroxine.  * Hyperlipidemia   Continue statin.   All the records are reviewed and case discussed with ED provider. Management plans discussed with the patient, family and they are in agreement.  CODE STATUS: Full code. Code Status History    This patient does not have a recorded code status. Please follow your organizational policy for patients in this  situation.       TOTAL TIME TAKING CARE OF THIS PATIENT: 50  minutes.    Vaughan Basta M.D on 09/19/2015   Between 7am to 6pm - Pager - [phone]  After 6pm go to [email or web address] - password [email or web address] [facility]  Office  [phone]  CC: Primary care physician; Tracie Harrier, MD   Note: This  dictation was prepared with Dragon dictation along with smaller phrase technology. Any transcriptional errors that result from this process are unintentional.

## 2015-09-19 NOTE — Care Management Obs Status (Signed)
[facility] NOTIFICATION   Patient Details  Name: Betty Peterson MRN: [id number] Date of Birth: [birth year]   Medicare Observation Status Notification Given:  Yes    CrutchfieldAntony Haste, RN 09/19/2015, 9:34 PM

## 2015-09-19 NOTE — Progress Notes (Signed)

## 2015-09-19 NOTE — Progress Notes (Signed)
Pt came to floor at 1750. VSS. Pt was C/O pain. Not time for pain meds yet. Pt was alert and oriented to room and safety plan.

## 2015-09-20 LAB — CBC
HCT: 33.1 % — ABNORMAL LOW (ref 35.0–47.0)
Hemoglobin: 11.1 g/dL — ABNORMAL LOW (ref 12.0–16.0)
MCH: 31.8 pg (ref 26.0–34.0)
MCHC: 33.6 g/dL (ref 32.0–36.0)
MCV: 94.6 fL (ref 80.0–100.0)
PLATELETS: 198 10*3/uL (ref 150–440)
RBC: 3.5 MIL/uL — AB (ref 3.80–5.20)
RDW: 13.1 % (ref 11.5–14.5)
WBC: 9.1 10*3/uL (ref 3.6–11.0)

## 2015-09-20 LAB — BASIC METABOLIC PANEL
Anion gap: 6 (ref 5–15)
BUN: 8 mg/dL (ref 6–20)
CALCIUM: 8.8 mg/dL — AB (ref 8.9–10.3)
CO2: 25 mmol/L (ref 22–32)
CREATININE: 0.9 mg/dL (ref 0.44–1.00)
Chloride: 108 mmol/L (ref 101–111)
GFR calc non Af Amer: >60 mL/min (ref >60)
GLUCOSE: 86 mg/dL (ref 65–99)
Potassium: 4 mmol/L (ref 3.5–5.1)
Sodium: 139 mmol/L (ref 135–145)

## 2015-09-20 LAB — GLUCOSE, CAPILLARY
GLUCOSE-CAPILLARY: 90 mg/dL (ref 65–99)
Glucose-Capillary: 108 mg/dL — ABNORMAL HIGH (ref 65–99)
Glucose-Capillary: 118 mg/dL — ABNORMAL HIGH (ref 65–99)
Glucose-Capillary: 110 mg/dL — ABNORMAL HIGH (ref 65–99)

## 2015-09-20 MED ORDER — HYDROMORPHONE HCL 1 MG/ML IJ SOLN
1.0000 mg | INTRAMUSCULAR | Status: DC | PRN
  Administered 2015-09-20 – 2015-09-22 (×8): 1 mg via INTRAVENOUS
  Filled 2015-09-20 (×8): qty 1

## 2015-09-20 MED ORDER — METOCLOPRAMIDE HCL 5 MG/ML IJ SOLN
10.0000 mg | Freq: Four times a day (QID) | INTRAMUSCULAR | Status: DC
  Administered 2015-09-20 – 2015-09-22 (×8): 10 mg via INTRAVENOUS
  Filled 2015-09-20 (×8): qty 2

## 2015-09-20 MED ORDER — ONDANSETRON HCL 4 MG/2ML IJ SOLN
4.0000 mg | Freq: Four times a day (QID) | INTRAMUSCULAR | Status: DC
Start: 2015-09-20 — End: 2015-09-22
  Administered 2015-09-20 – 2015-09-22 (×7): 4 mg via INTRAVENOUS
  Filled 2015-09-20 (×7): qty 2

## 2015-09-20 MED ORDER — ACETAMINOPHEN 500 MG PO TABS: 500.0000 mg | ORAL_TABLET | ORAL | Status: DC | PRN

## 2015-09-20 MED ORDER — ENOXAPARIN SODIUM 40 MG/0.4ML ~~LOC~~ SOLN
40.0000 mg | SUBCUTANEOUS | Status: DC
  Administered 2015-09-20 – 2015-09-21 (×2): 40 mg via SUBCUTANEOUS
  Filled 2015-09-20 (×2): qty 0.4

## 2015-09-20 NOTE — Progress Notes (Signed)
Initial Nutrition Assessment  DOCUMENTATION CODES:   Obesity unspecified  INTERVENTION:  -Monitor intake and diet progression -If unable to progress diet and intake does not increase recommend adding Ensure Enlive po BID, each supplement provides 350 kcal and 20 grams of protein   NUTRITION DIAGNOSIS:   Inadequate oral intake related to altered GI function as evidenced by per patient/family report.   GOAL:   Patient will meet greater than or equal to 90% of their needs   MONITOR:   PO intake, Diet advancement, Supplement acceptance  REASON FOR ASSESSMENT:   Malnutrition Screening Tool    ASSESSMENT:     Pt admitted with abdominal pain, nausea, vomiting unable to tolerating po diet.  Noted work up at [facility] in 1994 and found to have gastroparesis  Past Medical History  Diagnosis Date  . Gastroparesis   . Arthritis   . Asthma   . Fibromyalgia   . Thyroid disease   . Hypertension   . Sleep apnea    Pt reports ate some of yogurt this am.  Reports poor po intake since 6/7 secondary to nausea and vomiting  Medications reviewed: folic acid, vit D, aspart, mag ox, reglan, remeron, MVI, omega 3, KCL, vit B 12  Labs reviewed:   Diet Order:  Diet full liquid Room service appropriate?: Yes; Fluid consistency:: Thin  Skin:  Reviewed, no issues  Last BM:  6/11  Height:   Ht Readings from Last 1 Encounters:  09/19/15 5\' 7"  (1.702 m)    Weight: Pt reports that she has lost 12 pounds in the last 8 days (5% wt loss in the last 8 days). No wt trends to review per chart  Wt Readings from Last 1 Encounters:  09/19/15 211 lb 4.8 oz (95.845 kg)    Ideal Body Weight:     BMI:  Body mass index is 33.09 kg/(m^2).  Estimated Nutritional Needs:   Kcal:  [id number] -[id number] kcals/d  Protein:  79 - 102g/d  Fluid:  1.5-2 L/d  EDUCATION NEEDS:   No education needs identified at this time  Torin Modica B. Zenia Resides, [place], [place] (pager) Weekend/On-Call pager [phone])

## 2015-09-20 NOTE — Progress Notes (Signed)
Patient ID: Betty Peterson, female   DOB: [birth year], 55 y.o.   MRN: [id number] [facility] PROGRESS NOTE  Betty Peterson [id number] DOB: 02-04-1961 DOA: 09/19/2015 PCP: Tracie Harrier, MD  HPI/Subjective: Patient with 10 out of 10 abdominal pain and nausea. Last vomiting episode was last night. Not feeling well at all. All triggered when her Phenergan ran out secondary to cost issues.   Objective: Filed Vitals:   09/20/15 0601 09/20/15 1347  BP: 140/78 103/53  Pulse: 76 69  Temp: 98.7 F (37.1 C) 98.6 F (37 C)  Resp: 18 17    Filed Weights   09/19/15 1321 09/19/15 1745  Weight: 93.895 kg (207 lb) 95.845 kg (211 lb 4.8 oz)    ROS: Review of Systems  Constitutional: Negative for fever and chills.  Eyes: Negative for blurred vision.  Respiratory: Negative for cough and shortness of breath.   Cardiovascular: Negative for chest pain.  Gastrointestinal: Positive for nausea and abdominal pain. Negative for vomiting, diarrhea and constipation.  Genitourinary: Negative for dysuria.  Musculoskeletal: Negative for joint pain.  Neurological: Negative for dizziness and headaches.   Exam: Physical Exam  Constitutional: She is oriented to person, place, and time.  HENT:  Nose: No mucosal edema.  Mouth/Throat: No oropharyngeal exudate or posterior oropharyngeal edema.  Eyes: Conjunctivae, EOM and lids are normal. Pupils are equal, round, and reactive to light.  Neck: No JVD present. Carotid bruit is not present. No edema present. No thyroid mass and no thyromegaly present.  Cardiovascular: S1 normal and S2 normal.  Exam reveals no gallop.   No murmur heard. Pulses:      Dorsalis pedis pulses are 2+ on the right side, and 2+ on the left side.  Respiratory: No respiratory distress. She has no wheezes. She has no rhonchi. She has no rales.  GI: Soft. Bowel sounds are normal. There is tenderness in the right upper quadrant, epigastric area and left upper quadrant.   Musculoskeletal:       Right ankle: She exhibits swelling.       Left ankle: She exhibits swelling.  Lymphadenopathy:    She has no cervical adenopathy.  Neurological: She is alert and oriented to person, place, and time. No cranial nerve deficit.  Skin: Skin is warm. No rash noted. Nails show no clubbing.  Psychiatric: She has a normal mood and affect.      Data Reviewed: Basic Metabolic Panel:  Recent Labs Lab 09/17/15 1606 09/19/15 1400 09/20/15 0410  NA 140 137 139  K 4.0 4.0 4.0  CL 102 101 108  CO2 27 25 25   GLUCOSE 108* 101* 86  BUN 9 12 8   CREATININE 1.08* 1.01* 0.90  CALCIUM 9.7 9.8 8.8*   Liver Function Tests:  Recent Labs Lab 09/17/15 1606 09/19/15 1400  AST 32 77*  ALT 16 23  ALKPHOS 79 79  BILITOT 0.5 0.3  PROT 7.3 7.2  ALBUMIN 4.5 4.4    Recent Labs Lab 09/17/15 1606 09/19/15 1400  LIPASE 26 24   CBC:  Recent Labs Lab 09/17/15 1606 09/19/15 1400 09/20/15 0410  WBC 8.2 10.8 9.1  HGB 12.7 12.8 11.1*  HCT 38.0 37.9 33.1*  MCV 93.3 93.1 94.6  PLT 230 234 198   Cardiac Enzymes:  Recent Labs Lab 09/17/15 1606 09/19/15 1400  TROPONINI <0.03 <0.03    CBG:  Recent Labs Lab 09/19/15 1716 09/19/15 2145 09/20/15 0734 09/20/15 1122  GLUCAP 84 137* 90 108*    Scheduled Meds: . bisacodyl  15 mg  Oral Daily  . bisoprolol  10 mg Oral Daily  . bumetanide  1 mg Oral Daily  . cholecalciferol  1,000 Units Oral Daily  . diphenhydrAMINE  25 mg Oral Q6H  . enoxaparin (LOVENOX) injection  40 mg Subcutaneous Q24H  . fluticasone  2 spray Each Nare Daily  . folic acid  1 mg Oral Daily  . insulin aspart  0-9 Units Subcutaneous TID WC  . iron polysaccharides  150 mg Oral Daily  . levofloxacin  500 mg Oral Daily  . levothyroxine  150 mcg Oral QAC breakfast  . loratadine  10 mg Oral Daily  . losartan  100 mg Oral Daily  . magnesium oxide  400 mg Oral TID  . Melatonin  10 mg Oral QHS  . metoCLOPramide (REGLAN) injection  10 mg  Intravenous Q6H  . metoCLOPramide  10 mg Oral QID  . multivitamin with minerals  1 tablet Oral Daily  . omega-3 acid ethyl esters  1,000 mg Oral TID  . ondansetron (ZOFRAN) IV  4 mg Intravenous Q6H  . pantoprazole  40 mg Oral Daily  . polyethylene glycol  17 g Oral QHS  . potassium chloride  10 mEq Oral BID  . pravastatin  20 mg Oral QHS  . tiZANidine  6 mg Oral TID  . vitamin B-12  1,000 mcg Oral Daily   Continuous Infusions: . sodium chloride 75 mL/hr at 09/20/15 1223    Assessment/Plan:  1. Acute gastroparesis with severe abdominal pain and nausea. Vomiting seems to have subsided at this point. Supportive care with standing dose IV Reglan and Zofran. When necessary pain medications 2. essential hypertension continue usual medication 3. Hyperlipidemia unspecified on pravastatin 4. Fibromyalgia 5. Hypothyroidism unspecified on levothyroxine 6. History of sleep apnea  Code Status:     Code Status Orders        Start     Ordered   09/19/15 1800  Full code   Continuous     09/19/15 1759    Code Status History    Date Active Date Inactive Code Status Order ID Comments User Context   This patient has a current code status but no historical code status.     Family Communication: Husband at bedside  Disposition Plan: home once abdominal pain settles down  Time spent: 25 minutes  [place], [place]

## 2015-09-21 LAB — GLUCOSE, CAPILLARY
GLUCOSE-CAPILLARY: 115 mg/dL — AB (ref 65–99)
Glucose-Capillary: 90 mg/dL (ref 65–99)
Glucose-Capillary: 90 mg/dL (ref 65–99)
Glucose-Capillary: 88 mg/dL (ref 65–99)

## 2015-09-21 LAB — BASIC METABOLIC PANEL
ANION GAP: 7 (ref 5–15)
BUN: 7 mg/dL (ref 6–20)
CALCIUM: 8.9 mg/dL (ref 8.9–10.3)
CO2: 27 mmol/L (ref 22–32)
Chloride: 104 mmol/L (ref 101–111)
Creatinine, Ser: 0.89 mg/dL (ref 0.44–1.00)
GFR calc Af Amer: >60 mL/min (ref >60)
GLUCOSE: 96 mg/dL (ref 65–99)
Potassium: 3.7 mmol/L (ref 3.5–5.1)
Sodium: 138 mmol/L (ref 135–145)

## 2015-09-21 MED ORDER — ALUM & MAG HYDROXIDE-SIMETH 200-200-20 MG/5ML PO SUSP: 30.0000 mL | Freq: Four times a day (QID) | ORAL | Status: DC | PRN

## 2015-09-21 MED ORDER — SODIUM CHLORIDE 0.9 % IV SOLN
INTRAVENOUS | Status: DC
Start: 2015-09-21 — End: 2015-09-22
  Administered 2015-09-21 – 2015-09-22 (×2): via INTRAVENOUS

## 2015-09-21 MED ORDER — DIPHENHYDRAMINE HCL 25 MG PO CAPS: 25.0000 mg | ORAL_CAPSULE | Freq: Four times a day (QID) | ORAL | Status: DC | PRN

## 2015-09-21 MED ORDER — FAMOTIDINE 20 MG PO TABS
40.0000 mg | ORAL_TABLET | Freq: Two times a day (BID) | ORAL | Status: DC
Start: 2015-09-21 — End: 2015-09-22
  Administered 2015-09-21 – 2015-09-22 (×3): 40 mg via ORAL
  Filled 2015-09-21 (×3): qty 2

## 2015-09-21 NOTE — Care Management (Signed)
Patient admitted for gastroparesis.  Patient lives at home with daughter and husband.  Reported that patient has nocturnal O2 at home. Patient states that her home oxygen is provided by [employer].  Patient has not required any additional oxygen while in the hospital.  Patient obtains her medications from [facility] mail [facility], and from [facility] on [address].  Patient states that her Phenergan, Lunesta, and Compazine she is not able to obtain due to cost. Patient states "my husband is working with Suzie Portela to try to get them cheaper".  I also gave patient information for goodrx. Com and inquired if she had checked in to medication assistance online through [employer].  Patient states "i already know about that".  No RNCM needs identified.

## 2015-09-21 NOTE — Progress Notes (Signed)
Patient ID: Betty Peterson, female   DOB: 07-04-1960, 55 y.o.   MRN: [id number] [facility] PROGRESS NOTE  Betty Peterson [id number] DOB: 18-Aug-1960 DOA: 09/19/2015 PCP: Tracie Harrier, MD  HPI/Subjective: Patient this morning still having 6 out of 10 abdominal pain. Felt nauseous but no vomiting. Did not feel well. Was not ready to advance her diet. This afternoon feeling a little bit better and would like to try solid food for dinner.  Objective: Filed Vitals:   09/21/15 0526 09/21/15 1159  BP: 137/86 128/76  Pulse: 77 76  Temp: 98.5 F (36.9 C) 98.5 F (36.9 C)  Resp: 16 18    Filed Weights   09/19/15 1321 09/19/15 1745  Weight: 93.895 kg (207 lb) 95.845 kg (211 lb 4.8 oz)    ROS: Review of Systems  Constitutional: Negative for fever and chills.  Eyes: Negative for blurred vision.  Respiratory: Negative for cough and shortness of breath.   Cardiovascular: Negative for chest pain.  Gastrointestinal: Positive for nausea and abdominal pain. Negative for vomiting, diarrhea and constipation.  Genitourinary: Negative for dysuria.  Musculoskeletal: Negative for joint pain.  Neurological: Negative for dizziness and headaches.   Exam: Physical Exam  Constitutional: She is oriented to person, place, and time.  HENT:  Nose: No mucosal edema.  Mouth/Throat: No oropharyngeal exudate or posterior oropharyngeal edema.  Eyes: Conjunctivae, EOM and lids are normal. Pupils are equal, round, and reactive to light.  Neck: No JVD present. Carotid bruit is not present. No edema present. No thyroid mass and no thyromegaly present.  Cardiovascular: S1 normal and S2 normal.  Exam reveals no gallop.   No murmur heard. Pulses:      Dorsalis pedis pulses are 2+ on the right side, and 2+ on the left side.  Respiratory: No respiratory distress. She has no wheezes. She has no rhonchi. She has no rales.  GI: Soft. Bowel sounds are normal. There is tenderness in the right upper  quadrant, epigastric area and left upper quadrant.  Musculoskeletal:       Right ankle: She exhibits swelling.       Left ankle: She exhibits swelling.  Lymphadenopathy:    She has no cervical adenopathy.  Neurological: She is alert and oriented to person, place, and time. No cranial nerve deficit.  Skin: Skin is warm. No rash noted. Nails show no clubbing.  Psychiatric: She has a normal mood and affect.      Data Reviewed: Basic Metabolic Panel:  Recent Labs Lab 09/17/15 1606 09/19/15 1400 09/20/15 0410 09/21/15 0500  NA 140 137 139 138  K 4.0 4.0 4.0 3.7  CL 102 101 108 104  CO2 27 25 25 27   GLUCOSE 108* 101* 86 96  BUN 9 12 8 7   CREATININE 1.08* 1.01* 0.90 0.89  CALCIUM 9.7 9.8 8.8* 8.9   Liver Function Tests:  Recent Labs Lab 09/17/15 1606 09/19/15 1400  AST 32 77*  ALT 16 23  ALKPHOS 79 79  BILITOT 0.5 0.3  PROT 7.3 7.2  ALBUMIN 4.5 4.4    Recent Labs Lab 09/17/15 1606 09/19/15 1400  LIPASE 26 24   CBC:  Recent Labs Lab 09/17/15 1606 09/19/15 1400 09/20/15 0410  WBC 8.2 10.8 9.1  HGB 12.7 12.8 11.1*  HCT 38.0 37.9 33.1*  MCV 93.3 93.1 94.6  PLT 230 234 198   Cardiac Enzymes:  Recent Labs Lab 09/17/15 1606 09/19/15 1400  TROPONINI <0.03 <0.03    CBG:  Recent Labs Lab 09/20/15 1122 09/20/15 1648  09/20/15 2059 09/21/15 0721 09/21/15 1121  GLUCAP 108* 118* 110* 90 115*    Scheduled Meds: . bisacodyl  15 mg Oral Daily  . bisoprolol  10 mg Oral Daily  . bumetanide  1 mg Oral Daily  . cholecalciferol  1,000 Units Oral Daily  . enoxaparin (LOVENOX) injection  40 mg Subcutaneous Q24H  . famotidine  40 mg Oral BID  . fluticasone  2 spray Each Nare Daily  . folic acid  1 mg Oral Daily  . insulin aspart  0-9 Units Subcutaneous TID WC  . iron polysaccharides  150 mg Oral Daily  . levofloxacin  500 mg Oral Daily  . levothyroxine  150 mcg Oral QAC breakfast  . loratadine  10 mg Oral Daily  . losartan  100 mg Oral Daily  .  magnesium oxide  400 mg Oral TID  . Melatonin  10 mg Oral QHS  . metoCLOPramide (REGLAN) injection  10 mg Intravenous Q6H  . multivitamin with minerals  1 tablet Oral Daily  . omega-3 acid ethyl esters  1,000 mg Oral TID  . ondansetron (ZOFRAN) IV  4 mg Intravenous Q6H  . pantoprazole  40 mg Oral Daily  . polyethylene glycol  17 g Oral QHS  . potassium chloride  10 mEq Oral BID  . pravastatin  20 mg Oral QHS  . tiZANidine  6 mg Oral TID  . vitamin B-12  1,000 mcg Oral Daily   Continuous Infusions: . sodium chloride 60 mL/hr at 09/21/15 0842    Assessment/Plan:  1. Acute gastroparesis with severe abdominal pain and nausea. Vomiting seems to have subsided at this point. Supportive care with standing dose IV Reglan and Zofran. When necessary pain medications. I added Zantac. 2. essential hypertension continue usual medication 3. Hyperlipidemia unspecified on pravastatin 4. Fibromyalgia 5. Hypothyroidism unspecified on levothyroxine 6. History of sleep apnea  Code Status:     Code Status Orders        Start     Ordered   09/19/15 1800  Full code   Continuous     09/19/15 1759    Code Status History    Date Active Date Inactive Code Status Order ID Comments User Context   This patient has a current code status but no historical code status.     Family Communication: Husband Yesterday Disposition Plan: Potentially home tomorrow if doing better.  Time spent: 25 minutes  Loletha Grayer  [employer]

## 2015-09-22 LAB — GLUCOSE, CAPILLARY: GLUCOSE-CAPILLARY: 89 mg/dL (ref 65–99)

## 2015-09-22 NOTE — Discharge Summary (Signed)
[place] [facility] [place] NAME: Betty Peterson    MR#:  [id number]  DATE OF BIRTH:  11-Feb-1970  DATE OF ADMISSION:  09/19/2015 ADMITTING PHYSICIAN: Vaughan Basta, MD  DATE OF DISCHARGE: 09/22/2015 11:11 AM  PRIMARY CARE PHYSICIAN: Tracie Harrier, MD    ADMISSION DIAGNOSIS:  Intractable vomiting with nausea, vomiting of unspecified type [R11.10]  DISCHARGE DIAGNOSIS:  Principal Problem:   Intractable vomiting   SECONDARY DIAGNOSIS:   Past Medical History  Diagnosis Date  . Gastroparesis   . Arthritis   . Asthma   . Fibromyalgia   . Thyroid disease   . Hypertension   . Sleep apnea     HOSPITAL COURSE:   1. Acute gastroparesis with severe abdominal pain and nausea and vomiting. The patient was admitted to the hospital and given standing doses IV Reglan and IV Zofran. As necessary pain medications. She improved over the few days being in the hospital. Patient tolerated solid food upon discharge home. The patient thinks that it's because she ran out of her Phenergan at home. She has it filled now. She has all of her usual medications at home. 2. Essential hypertension continue usual medications 3. Hyperlipidemia unspecified on pravastatin 4. Fibromyalgia 5. Hypothyroidism unspecified on levothyroxine 6. History of sleep apnea  DISCHARGE CONDITIONS:   Satisfactory  CONSULTS OBTAINED:  None  DRUG ALLERGIES:   Allergies  Allergen Reactions  . Ambien [Zolpidem Tartrate] Other (See Comments)    hallucinations  . Avelox [Moxifloxacin Hcl In Nacl] Hives  . 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  . Butorphanol Tartrate Other (See Comments)  . Carafate [Sucralfate] Hives  . Morphine And Related Nausea And Vomiting  . Prednisone Nausea And Vomiting  . Simvastatin Other (See Comments)    Muscle pain  . Tape Other (See Comments)    blisters  . Tetracyclines & Related  Nausea And Vomiting  . Biaxin [Clarithromycin] Palpitations    DISCHARGE MEDICATIONS:   Discharge Medication List as of 09/22/2015 10:10 AM    CONTINUE these medications which have NOT CHANGED   Details  acetaminophen (TYLENOL) 500 MG tablet Take 500 mg by mouth every 4 (four) hours as needed for mild pain, moderate pain, fever or headache., Until Discontinued, Historical Med    bisacodyl (BISACODYL LAXATIVE) 5 MG EC tablet Take 15 mg by mouth daily., Until Discontinued, Historical Med    bisoprolol (ZEBETA) 10 MG tablet Take 10 mg by mouth daily., Until Discontinued, Historical Med    bumetanide (BUMEX) 1 MG tablet Take 1 mg by mouth daily., Until Discontinued, Historical Med    chlorpheniramine (CHLOR-TRIMETON) 4 MG tablet Take 4 mg by mouth every 6 (six) hours as needed for allergies., Until Discontinued, Historical Med    Cholecalciferol 1000 units capsule Take 1,000 Units by mouth daily., Until Discontinued, Historical Med    Coenzyme Q10 (COQ10) 200 MG CAPS Take 200 mg by mouth daily., Until Discontinued, Historical Med    diphenhydrAMINE (BENADRYL) 25 MG tablet Take 50 mg by mouth every 4 (four) hours as needed for itching, allergies or sleep., Until Discontinued, Historical Med    fluticasone (FLONASE) 50 MCG/ACT nasal spray Place 2 sprays into both nostrils daily., Until Discontinued, Historical Med    folic acid (FOLVITE) 1 MG tablet Take 1 mg by mouth daily., Until Discontinued, Historical Med    iron polysaccharides (NIFEREX) 150 MG capsule Take 150 mg by mouth daily., Until Discontinued, Historical Med  Lactobacillus (PROBIOTIC ACIDOPHILUS PO) Take 3 tablets by mouth daily., Until Discontinued, Historical Med    levocetirizine (XYZAL) 5 MG tablet Take 5 mg by mouth every evening., Until Discontinued, Historical Med    levofloxacin (LEVAQUIN) 500 MG tablet Take 500 mg by mouth daily. X 10 days., Starting 09/13/2015, Until Thu 09/23/15, Historical Med    levothyroxine  (SYNTHROID, LEVOTHROID) 150 MCG tablet Take 150 mcg by mouth daily before breakfast. Take on an empty stomach with a glass of water at least 30 minutes before breakfast., Until Discontinued, Historical Med    losartan (COZAAR) 100 MG tablet Take 100 mg by mouth daily., Until Discontinued, Historical Med    Magnesium 200 MG TABS Take 250 mg by mouth 3 (three) times daily., Until Discontinued, Historical Med    Melatonin 10 MG CAPS Take 10 mg by mouth at bedtime., Until Discontinued, Historical Med    metoCLOPramide (REGLAN) 10 MG tablet Take 10 mg by mouth 4 (four) times daily., Until Discontinued, Historical Med    Multiple Vitamin (MULTIVITAMIN) tablet Take 1 tablet by mouth daily., Until Discontinued, Historical Med    Omega-3 Fatty Acids (FISH OIL EXTRA STRENGTH) 1200 MG CAPS Take 1,200 mg by mouth 3 (three) times daily., Until Discontinued, Historical Med    omeprazole (PRILOSEC) 40 MG capsule Take 40 mg by mouth daily., Until Discontinued, Historical Med    ondansetron (ZOFRAN) 4 MG tablet Take 4 mg by mouth 2 (two) times daily as needed for nausea or vomiting., Until Discontinued, Historical Med    POLYETHYLENE GLYCOL 3350 PO Take 17 g by mouth daily. *Mix in 4 - 8 ounces of fluid prior to taking*, Until Discontinued, Historical Med    potassium chloride (K-DUR) 10 MEQ tablet Take 10 mEq by mouth 2 (two) times daily., Until Discontinued, Historical Med    pravastatin (PRAVACHOL) 20 MG tablet Take 20 mg by mouth at bedtime., Until Discontinued, Historical Med    ranitidine (ZANTAC) 300 MG tablet Take 300 mg by mouth 2 (two) times daily., Until Discontinued, Historical Med    tiZANidine (ZANAFLEX) 2 MG tablet Take 6 mg by mouth 3 (three) times daily., Until Discontinued, Historical Med    traMADol (ULTRAM) 50 MG tablet Take 50 mg by mouth every 4 (four) hours as needed for moderate pain or severe pain., Until Discontinued, Historical Med    vitamin B-12 (CYANOCOBALAMIN) 1000 MCG  tablet Take 1,000 mcg by mouth daily., Until Discontinued, Historical Med      STOP taking these medications     ibuprofen (ADVIL,MOTRIN) 800 MG tablet          DISCHARGE INSTRUCTIONS:   Follow-up medical doctor one week  If you experience worsening of your admission symptoms, develop shortness of breath, life threatening emergency, suicidal or homicidal thoughts you must seek medical attention immediately by calling 911 or calling your MD immediately  if symptoms less severe.  You Must read complete instructions/literature along with all the possible adverse reactions/side effects for all the Medicines you take and that have been prescribed to you. Take any new Medicines after you have completely understood and accept all the possible adverse reactions/side effects.   Please note  You were cared for by a hospitalist during your hospital stay. If you have any questions about your discharge medications or the care you received while you were in the hospital after you are discharged, you can call the unit and asked to speak with the hospitalist on call if the hospitalist that took care of  you is not available. Once you are discharged, your primary care physician will handle any further medical issues. Please note that NO REFILLS for any discharge medications will be authorized once you are discharged, as it is imperative that you return to your primary care physician (or establish a relationship with a primary care physician if you do not have one) for your aftercare needs so that they can reassess your need for medications and monitor your lab values.    Today   CHIEF COMPLAINT:   Chief Complaint  Patient presents with  . Abdominal Pain  . Emesis    HISTORY OF PRESENT ILLNESS:  Betty Peterson  is a 55 y.o. female with a known history of Presented with nausea vomiting and abdominal pain. Patient has a history of gastroparesis   VITAL SIGNS:  Blood pressure 127/72, pulse 61,  temperature 98.5 F (36.9 C), temperature source Oral, resp. rate 18, height 5\' 7"  (1.702 m), weight 95.845 kg (211 lb 4.8 oz), SpO2 100 %.    PHYSICAL EXAMINATION:  GENERAL:  55 y.o.-year-old patient lying in the bed with no acute distress.  EYES: Pupils equal, round, reactive to light and accommodation. No scleral icterus. Extraocular muscles intact.  HEENT: Head atraumatic, normocephalic. Oropharynx and nasopharynx clear.  NECK:  Supple, no jugular venous distention. No thyroid enlargement, no tenderness.  LUNGS: Normal breath sounds bilaterally, no wheezing, rales,rhonchi or crepitation. No use of accessory muscles of respiration.  CARDIOVASCULAR: S1, S2 normal. No murmurs, rubs, or gallops.  ABDOMEN: Soft, Slight epigastric tenderness, non-distended. Bowel sounds present. No organomegaly or mass.  EXTREMITIES: No pedal edema, cyanosis, or clubbing.  NEUROLOGIC: Cranial nerves II through XII are intact. Muscle strength 5/5 in all extremities. Sensation intact. Gait not checked.  PSYCHIATRIC: The patient is alert and oriented x 3.  SKIN: No obvious rash, lesion, or ulcer.   DATA REVIEW:   CBC  Recent Labs Lab 09/20/15 0410  WBC 9.1  HGB 11.1*  HCT 33.1*  PLT 198    Chemistries   Recent Labs Lab 09/19/15 1400  09/21/15 0500  NA 137  < > 138  K 4.0  < > 3.7  CL 101  < > 104  CO2 25  < > 27  GLUCOSE 101*  < > 96  BUN 12  < > 7  CREATININE 1.01*  < > 0.89  CALCIUM 9.8  < > 8.9  AST 77*  --   --   ALT 23  --   --   ALKPHOS 79  --   --   BILITOT 0.3  --   --   < > = values in this interval not displayed.  Cardiac Enzymes  Recent Labs Lab 09/19/15 1400  TROPONINI <0.03    Management plans discussed with the patient, And she is in agreement.  CODE STATUS:  Code Status History    Date Active Date Inactive Code Status Order ID Comments User Context   09/19/2015  5:59 PM 09/22/2015  2:11 PM Full Code [id number]  Vaughan Basta, MD Inpatient      TOTAL  TIME TAKING CARE OF THIS PATIENT: 35 minutes.    Loletha Grayer M.D on 09/22/2015 at 4:54 PM  Between 7am to 6pm - Pager - [phone]  After 6pm go to [email or web address] - password [email or web address]  [facility] Office  [phone]  CC: Primary care physician; Tracie Harrier, MD

## 2015-09-22 NOTE — Discharge Instructions (Signed)
Patient also has phenergan at home that she takes.

## 2015-09-22 NOTE — Progress Notes (Signed)
09/22/2015 11:04 AM  BP 127/72 mmHg  Pulse 61  Temp(Src) 98.5 F (36.9 C) (Oral)  Resp 18  Ht 5\' 7"  (1.702 m)  Wt 95.845 kg (211 lb 4.8 oz)  BMI 33.09 kg/m2  SpO2 100% Patient discharged per MD orders. Discharge instructions reviewed with patient and patient verbalized understanding. IV removed per policy.  Discharged via wheelchair escorted by auxilary.  Almedia Balls, RN

## 2015-12-07 ENCOUNTER — Other Ambulatory Visit: Payer: Self-pay | Admitting: Internal Medicine

## 2015-12-07 DIAGNOSIS — N6322 Unspecified lump in the left breast, upper inner quadrant: Secondary | ICD-10-CM

## 2015-12-29 ENCOUNTER — Ambulatory Visit
Admission: RE | Admit: 2015-12-29 | Discharge: 2015-12-29 | Disposition: A | Discharge status: Home / Self Care | Payer: Medicare Other | Source: Ambulatory Visit | Attending: Internal Medicine | Admitting: Internal Medicine

## 2015-12-29 DIAGNOSIS — N63 Unspecified lump in breast: Secondary | ICD-10-CM | POA: Insufficient documentation

## 2015-12-29 DIAGNOSIS — N6322 Unspecified lump in the left breast, upper inner quadrant: Secondary | ICD-10-CM

## 2016-09-22 ENCOUNTER — Other Ambulatory Visit: Payer: Self-pay | Admitting: Internal Medicine

## 2016-09-22 DIAGNOSIS — M542 Cervicalgia: Secondary | ICD-10-CM

## 2016-09-28 ENCOUNTER — Ambulatory Visit
Admission: RE | Admit: 2016-09-28 | Discharge: 2016-09-28 | Disposition: A | Discharge status: Home / Self Care | Payer: Medicare Other | Source: Ambulatory Visit | Attending: Internal Medicine | Admitting: Internal Medicine

## 2016-09-28 DIAGNOSIS — R590 Localized enlarged lymph nodes: Secondary | ICD-10-CM | POA: Diagnosis not present

## 2016-09-28 DIAGNOSIS — M5030 Other cervical disc degeneration, unspecified cervical region: Secondary | ICD-10-CM | POA: Diagnosis not present

## 2016-09-28 DIAGNOSIS — M542 Cervicalgia: Secondary | ICD-10-CM

## 2017-01-10 ENCOUNTER — Other Ambulatory Visit: Payer: Self-pay | Admitting: Internal Medicine

## 2017-01-10 DIAGNOSIS — Z1231 Encounter for screening mammogram for malignant neoplasm of breast: Secondary | ICD-10-CM

## 2017-02-09 ENCOUNTER — Ambulatory Visit
Admission: RE | Admit: 2017-02-09 | Discharge: 2017-02-09 | Disposition: A | Discharge status: Home / Self Care | Payer: Medicare Other | Source: Ambulatory Visit | Attending: Internal Medicine | Admitting: Internal Medicine

## 2017-02-09 DIAGNOSIS — Z1231 Encounter for screening mammogram for malignant neoplasm of breast: Secondary | ICD-10-CM | POA: Diagnosis not present

## 2017-02-09 DIAGNOSIS — N6320 Unspecified lump in the left breast, unspecified quadrant: Secondary | ICD-10-CM | POA: Insufficient documentation

## 2017-02-09 DIAGNOSIS — R928 Other abnormal and inconclusive findings on diagnostic imaging of breast: Secondary | ICD-10-CM | POA: Insufficient documentation

## 2017-02-13 ENCOUNTER — Other Ambulatory Visit: Payer: Self-pay | Admitting: Internal Medicine

## 2017-02-13 DIAGNOSIS — R928 Other abnormal and inconclusive findings on diagnostic imaging of breast: Secondary | ICD-10-CM

## 2017-02-21 ENCOUNTER — Ambulatory Visit
Admission: RE | Admit: 2017-02-21 | Discharge: 2017-02-21 | Disposition: A | Discharge status: Home / Self Care | Payer: Medicare Other | Source: Ambulatory Visit | Attending: Internal Medicine | Admitting: Internal Medicine

## 2017-02-21 DIAGNOSIS — R928 Other abnormal and inconclusive findings on diagnostic imaging of breast: Secondary | ICD-10-CM

## 2017-02-21 DIAGNOSIS — N6489 Other specified disorders of breast: Secondary | ICD-10-CM | POA: Diagnosis not present

## 2018-01-28 ENCOUNTER — Other Ambulatory Visit: Payer: Self-pay | Admitting: Internal Medicine

## 2018-01-28 DIAGNOSIS — Z1231 Encounter for screening mammogram for malignant neoplasm of breast: Secondary | ICD-10-CM

## 2018-02-07 ENCOUNTER — Other Ambulatory Visit: Payer: Self-pay | Admitting: Internal Medicine

## 2018-02-07 DIAGNOSIS — R140 Abdominal distension (gaseous): Secondary | ICD-10-CM

## 2018-02-07 DIAGNOSIS — R609 Edema, unspecified: Secondary | ICD-10-CM

## 2018-02-25 ENCOUNTER — Ambulatory Visit
Admission: RE | Admit: 2018-02-25 | Discharge: 2018-02-25 | Disposition: A | Discharge status: Home / Self Care | Payer: Medicare Other | Source: Ambulatory Visit | Attending: Internal Medicine | Admitting: Internal Medicine

## 2018-02-25 DIAGNOSIS — R609 Edema, unspecified: Secondary | ICD-10-CM | POA: Diagnosis not present

## 2018-02-25 DIAGNOSIS — R140 Abdominal distension (gaseous): Secondary | ICD-10-CM

## 2018-02-25 DIAGNOSIS — Z1231 Encounter for screening mammogram for malignant neoplasm of breast: Secondary | ICD-10-CM | POA: Insufficient documentation

## 2018-05-10 ENCOUNTER — Ambulatory Visit: Payer: Medicare Other | Attending: Internal Medicine

## 2018-05-10 DIAGNOSIS — G4761 Periodic limb movement disorder: Secondary | ICD-10-CM | POA: Insufficient documentation

## 2018-05-10 DIAGNOSIS — G4733 Obstructive sleep apnea (adult) (pediatric): Secondary | ICD-10-CM | POA: Insufficient documentation

## 2019-01-17 ENCOUNTER — Other Ambulatory Visit: Admission: RE | Admit: 2019-01-17 | Payer: Medicare Other | Source: Ambulatory Visit

## 2019-01-21 ENCOUNTER — Ambulatory Visit: Payer: Medicare Other | Attending: Internal Medicine

## 2019-01-21 DIAGNOSIS — G4733 Obstructive sleep apnea (adult) (pediatric): Secondary | ICD-10-CM | POA: Diagnosis not present

## 2019-01-22 ENCOUNTER — Other Ambulatory Visit: Payer: Self-pay

## 2019-02-17 ENCOUNTER — Other Ambulatory Visit: Payer: Self-pay | Admitting: Neurology

## 2019-02-17 DIAGNOSIS — G43011 Migraine without aura, intractable, with status migrainosus: Secondary | ICD-10-CM

## 2019-02-27 ENCOUNTER — Ambulatory Visit: Payer: Medicare Other

## 2019-03-17 ENCOUNTER — Other Ambulatory Visit: Payer: Self-pay | Admitting: Internal Medicine

## 2019-03-17 DIAGNOSIS — Z1231 Encounter for screening mammogram for malignant neoplasm of breast: Secondary | ICD-10-CM

## 2019-03-19 ENCOUNTER — Ambulatory Visit: Payer: Medicare Other

## 2019-03-26 ENCOUNTER — Other Ambulatory Visit: Payer: Self-pay

## 2019-03-26 ENCOUNTER — Ambulatory Visit
Admission: RE | Admit: 2019-03-26 | Discharge: 2019-03-26 | Disposition: A | Discharge status: Home / Self Care | Payer: Medicare Other | Source: Ambulatory Visit | Attending: Neurology | Admitting: Neurology

## 2019-03-26 ENCOUNTER — Ambulatory Visit
Admission: RE | Admit: 2019-03-26 | Discharge: 2019-03-26 | Disposition: A | Discharge status: Home / Self Care | Payer: Medicare Other | Source: Ambulatory Visit | Attending: Internal Medicine | Admitting: Internal Medicine

## 2019-03-26 DIAGNOSIS — Z1231 Encounter for screening mammogram for malignant neoplasm of breast: Secondary | ICD-10-CM | POA: Insufficient documentation

## 2019-03-26 DIAGNOSIS — G43011 Migraine without aura, intractable, with status migrainosus: Secondary | ICD-10-CM | POA: Diagnosis not present

## 2019-09-29 ENCOUNTER — Other Ambulatory Visit: Payer: Self-pay | Admitting: Neurology

## 2019-09-29 DIAGNOSIS — M5412 Radiculopathy, cervical region: Secondary | ICD-10-CM

## 2019-10-24 ENCOUNTER — Other Ambulatory Visit: Payer: Self-pay

## 2019-10-24 ENCOUNTER — Ambulatory Visit (HOSPITAL_COMMUNITY)
Admission: RE | Admit: 2019-10-24 | Discharge: 2019-10-24 | Disposition: A | Discharge status: Home / Self Care | Payer: Medicare Other | Source: Ambulatory Visit | Attending: Neurology | Admitting: Neurology

## 2019-10-24 DIAGNOSIS — M5412 Radiculopathy, cervical region: Secondary | ICD-10-CM

## 2020-01-23 ENCOUNTER — Other Ambulatory Visit: Payer: Self-pay | Admitting: Student

## 2020-01-23 ENCOUNTER — Other Ambulatory Visit (HOSPITAL_COMMUNITY): Payer: Self-pay | Admitting: Student

## 2020-01-23 DIAGNOSIS — N183 Chronic kidney disease, stage 3 unspecified: Secondary | ICD-10-CM

## 2020-02-04 ENCOUNTER — Ambulatory Visit (HOSPITAL_COMMUNITY)
Admission: RE | Admit: 2020-02-04 | Discharge: 2020-02-04 | Disposition: A | Discharge status: Home / Self Care | Payer: Medicare Other | Source: Ambulatory Visit | Attending: Student | Admitting: Student

## 2020-02-04 ENCOUNTER — Other Ambulatory Visit: Payer: Self-pay

## 2020-02-04 DIAGNOSIS — N183 Chronic kidney disease, stage 3 unspecified: Secondary | ICD-10-CM | POA: Diagnosis not present

## 2020-02-23 ENCOUNTER — Other Ambulatory Visit: Payer: Self-pay | Admitting: Internal Medicine

## 2020-02-23 DIAGNOSIS — Z1231 Encounter for screening mammogram for malignant neoplasm of breast: Secondary | ICD-10-CM

## 2020-03-11 ENCOUNTER — Emergency Department
Admission: EM | Admit: 2020-03-11 | Discharge: 2020-03-11 | Disposition: A | Discharge status: Home / Self Care | Payer: Medicare Other | Attending: Emergency Medicine | Admitting: Emergency Medicine

## 2020-03-11 ENCOUNTER — Other Ambulatory Visit: Payer: Self-pay

## 2020-03-11 DIAGNOSIS — Z5321 Procedure and treatment not carried out due to patient leaving prior to being seen by health care provider: Secondary | ICD-10-CM | POA: Diagnosis not present

## 2020-03-11 DIAGNOSIS — R110 Nausea: Secondary | ICD-10-CM | POA: Diagnosis not present

## 2020-03-11 DIAGNOSIS — I959 Hypotension, unspecified: Secondary | ICD-10-CM | POA: Diagnosis not present

## 2020-03-11 DIAGNOSIS — R531 Weakness: Secondary | ICD-10-CM | POA: Diagnosis not present

## 2020-03-11 DIAGNOSIS — R42 Dizziness and giddiness: Secondary | ICD-10-CM | POA: Diagnosis not present

## 2020-03-11 HISTORY — DX: Myasthenia gravis without (acute) exacerbation: G70.00

## 2020-03-11 LAB — CBC
HCT: 42.1 % (ref 36.0–46.0)
Hemoglobin: 13.7 g/dL (ref 12.0–15.0)
MCH: 31.7 pg (ref 26.0–34.0)
MCHC: 32.5 g/dL (ref 30.0–36.0)
MCV: 97.5 fL (ref 80.0–100.0)
Platelets: 341 10*3/uL (ref 150–400)
RBC: 4.32 MIL/uL (ref 3.87–5.11)
RDW: 13.2 % (ref 11.5–15.5)
WBC: 10.8 10*3/uL — ABNORMAL HIGH (ref 4.0–10.5)
nRBC: 0 % (ref 0.0–0.2)

## 2020-03-11 LAB — TROPONIN I (HIGH SENSITIVITY)
Troponin I (High Sensitivity): 4 ng/L (ref <18)
Troponin I (High Sensitivity): 3 ng/L (ref <18)

## 2020-03-11 LAB — BASIC METABOLIC PANEL
Anion gap: 14 (ref 5–15)
BUN: 25 mg/dL — ABNORMAL HIGH (ref 6–20)
CO2: 25 mmol/L (ref 22–32)
Calcium: 9.5 mg/dL (ref 8.9–10.3)
Chloride: 94 mmol/L — ABNORMAL LOW (ref 98–111)
Creatinine, Ser: 1.66 mg/dL — ABNORMAL HIGH (ref 0.44–1.00)
GFR, Estimated: 35 mL/min — ABNORMAL LOW (ref >60)
Glucose, Bld: 118 mg/dL — ABNORMAL HIGH (ref 70–99)
Potassium: 4.4 mmol/L (ref 3.5–5.1)
Sodium: 133 mmol/L — ABNORMAL LOW (ref 135–145)

## 2020-03-11 MED ORDER — ACETAMINOPHEN 325 MG PO TABS
650.0000 mg | ORAL_TABLET | Freq: Once | ORAL | Status: AC | PRN
  Administered 2020-03-11: 650 mg via ORAL
  Filled 2020-03-11: qty 2

## 2020-03-11 NOTE — ED Triage Notes (Signed)
Pt here via POV from home. Pt c/o hypotension since last night. Pt states she has been checking her bp with her home monitor, lowest reading she saw was 70/38 lastnight. Pt reports feeling light headed and weak. Reports constant nausea.   Pt was diagnosed with myasthenia gravis October 25th.   Pt a&Ox4, respirations even and unlabored.

## 2020-03-31 ENCOUNTER — Ambulatory Visit
Admission: RE | Admit: 2020-03-31 | Discharge: 2020-03-31 | Disposition: A | Discharge status: Home / Self Care | Payer: Medicare Other | Source: Ambulatory Visit | Attending: Internal Medicine | Admitting: Internal Medicine

## 2020-03-31 ENCOUNTER — Other Ambulatory Visit: Payer: Self-pay

## 2020-03-31 DIAGNOSIS — Z1231 Encounter for screening mammogram for malignant neoplasm of breast: Secondary | ICD-10-CM | POA: Insufficient documentation

## 2020-04-01 ENCOUNTER — Other Ambulatory Visit: Payer: Self-pay | Admitting: Physical Medicine & Rehabilitation

## 2020-04-01 DIAGNOSIS — M542 Cervicalgia: Secondary | ICD-10-CM

## 2020-04-01 DIAGNOSIS — M5412 Radiculopathy, cervical region: Secondary | ICD-10-CM

## 2020-04-08 ENCOUNTER — Ambulatory Visit
Admission: RE | Admit: 2020-04-08 | Discharge: 2020-04-08 | Disposition: A | Discharge status: Home / Self Care | Payer: Medicare Other | Source: Ambulatory Visit | Attending: Physical Medicine & Rehabilitation | Admitting: Physical Medicine & Rehabilitation

## 2020-04-08 ENCOUNTER — Other Ambulatory Visit: Payer: Self-pay

## 2020-04-08 DIAGNOSIS — M5412 Radiculopathy, cervical region: Secondary | ICD-10-CM

## 2020-04-08 DIAGNOSIS — M5031 Other cervical disc degeneration,  high cervical region: Secondary | ICD-10-CM | POA: Diagnosis not present

## 2020-04-08 DIAGNOSIS — M47812 Spondylosis without myelopathy or radiculopathy, cervical region: Secondary | ICD-10-CM | POA: Diagnosis not present

## 2020-04-08 DIAGNOSIS — M542 Cervicalgia: Secondary | ICD-10-CM

## 2020-04-08 DIAGNOSIS — M4722 Other spondylosis with radiculopathy, cervical region: Secondary | ICD-10-CM | POA: Insufficient documentation

## 2020-04-08 DIAGNOSIS — M4802 Spinal stenosis, cervical region: Secondary | ICD-10-CM | POA: Insufficient documentation

## 2020-04-08 DIAGNOSIS — G8929 Other chronic pain: Secondary | ICD-10-CM | POA: Diagnosis not present

## 2020-04-08 DIAGNOSIS — W19XXXA Unspecified fall, initial encounter: Secondary | ICD-10-CM | POA: Insufficient documentation

## 2020-04-08 LAB — CBC
HCT: 40.2 % (ref 36.0–46.0)
Hemoglobin: 12.8 g/dL (ref 12.0–15.0)
MCH: 31.7 pg (ref 26.0–34.0)
MCHC: 31.8 g/dL (ref 30.0–36.0)
MCV: 99.5 fL (ref 80.0–100.0)
Platelets: 297 10*3/uL (ref 150–400)
RBC: 4.04 MIL/uL (ref 3.87–5.11)
RDW: 13.1 % (ref 11.5–15.5)
WBC: 14.4 10*3/uL — ABNORMAL HIGH (ref 4.0–10.5)
nRBC: 0 % (ref 0.0–0.2)

## 2020-04-08 LAB — APTT: aPTT: <24 seconds — ABNORMAL LOW (ref 24–36)

## 2020-04-08 LAB — PROTIME-INR
INR: 0.9 (ref 0.8–1.2)
Prothrombin Time: 11.7 seconds (ref 11.4–15.2)

## 2020-04-08 MED ORDER — ACETAMINOPHEN 500 MG PO TABS: 1000.0000 mg | ORAL_TABLET | Freq: Four times a day (QID) | ORAL | Status: DC | PRN

## 2020-04-08 MED ORDER — IOPAMIDOL (ISOVUE-300) INJECTION 61%
10.0000 mL | Freq: Once | INTRAVENOUS | Status: AC | PRN
  Administered 2020-04-08: 10 mL

## 2020-04-08 NOTE — Progress Notes (Signed)
Patient to radiology via stretcher for myelogram.

## 2020-04-08 NOTE — Discharge Instructions (Signed)
Myelogram  A myelogram is an imaging test. This test checks for problems in the spinal cord and the places where nerves attach to the spinal cord (nerve roots). A dye (contrast material) is put into your spine before the X-ray. This provides a clearer image for your doctor to see. You may need this test if you have a spinal cord problem that cannot be diagnosed with other imaging tests. You may also have this test to check your spine after surgery. Tell a doctor about:  Any allergies you have, especially to iodine.  All medicines you are taking, including vitamins, herbs, eye drops, creams, and over-the-counter medicines.  Any problems you or family members have had with anesthetic medicines or dye.  Any blood disorders you have.  Any surgeries you have had.  Any medical conditions you have or have had, including asthma.  Whether you are pregnant or may be pregnant. What are the risks? Generally, this is a safe procedure. However, problems may occur, including:  Infection.  Bleeding.  Allergic reaction to medicines or dyes.  Damage to your spinal cord or nerves.  Leaking of spinal fluid. This can cause a headache.  Damage to kidneys.  Seizures. This is rare. What happens before the procedure?  Follow instructions from your doctor about what you cannot eat or drink. You may be asked to drink more fluids.  Ask your doctor about changing or stopping your normal medicines. This is important if you take diabetes medicines or blood thinners.  Plan to have someone take you home from the hospital or clinic.  If you will be going home right after the procedure, plan to have someone with you for 24 hours. What happens during the procedure?  You will lie face down on a table.  Your doctor will find the best injection site on your spine. This is most often in the lower back.  This area will be washed with soap.  You will be given a medicine to numb the area (local  anesthetic).  Your doctor will place a long needle into the space around your spinal cord.  A sample of spinal fluid may be taken. This may be sent to the lab for testing.  The dye will be injected into the space around your spinal cord.  The exam table may be tilted. This helps the dye flow up or down your spine.  The X-ray will take images of your spinal cord.  A bandage (dressing) may be placed over the area where the dye was injected. The procedure may vary among doctors and hospitals. What can I expect after the procedure?  You may be monitored until you leave the hospital or clinic. This includes checking your blood pressure, heart rate, breathing rate, and blood oxygen level.  You may feel sore at the injection site. You may have a mild headache.  You will be told to lie flat with your head raised (elevated). This lowers the risk of a headache.  It is up to you to get the results of your procedure. Ask your doctor, or the department that is doing the procedure, when your results will be ready. Follow these instructions at home:   Rest as told by your doctor. Lie flat with your head slightly elevated.  Do not bend, lift, or do hard work for 24-48 hours, or as told by your doctor.  Take over-the-counter and prescription medicines only as told by your doctor.  Take care of your bandage as told by your   doctor.  Drink enough fluid to keep your pee (urine) pale yellow.  Bathe or shower as told by your doctor. Contact a doctor if:  You have a fever.  You have a headache that lasts longer than 24 hours.  You feel sick to your stomach (nauseous).  You vomit.  Your neck is stiff.  Your legs feel numb.  You cannot pee.  You cannot poop (no bowel movement).  You have a rash.  You are itchy or sneezing. Get help right away if:  You have new symptoms or your symptoms get worse.  You have a seizure.  You have trouble breathing. Summary  A myelogram is an  imaging test that checks for problems in the spinal cord and the places where nerves attach to the spinal cord (nerve roots).  Before the procedure, follow instructions from your doctor. You will be told what not to eat or drink, or what medicines to change or stop.  After the procedure, you will be told to lie flat with your head raised (elevated). This will lower your risk of a headache.  Do not bend, lift, or do any hard work for 24-48 hours, or as told by your doctor.  Contact a doctor if you have a stiff neck or numb legs. Get help right away if your symptoms get worse, or you have a seizure or trouble breathing. This information is not intended to replace advice given to you by your health care provider. Make sure you discuss any questions you have with your health care provider. Document Revised: 06/05/2018 Document Reviewed: 06/06/2018 Elsevier Patient Education  2020 [employer]  

## 2020-04-13 DIAGNOSIS — M5412 Radiculopathy, cervical region: Secondary | ICD-10-CM | POA: Diagnosis not present

## 2020-04-13 DIAGNOSIS — M4802 Spinal stenosis, cervical region: Secondary | ICD-10-CM | POA: Diagnosis not present

## 2020-04-13 DIAGNOSIS — M542 Cervicalgia: Secondary | ICD-10-CM | POA: Diagnosis not present

## 2020-04-13 DIAGNOSIS — M069 Rheumatoid arthritis, unspecified: Secondary | ICD-10-CM | POA: Diagnosis not present

## 2020-04-14 DIAGNOSIS — R29898 Other symptoms and signs involving the musculoskeletal system: Secondary | ICD-10-CM | POA: Diagnosis not present

## 2020-04-14 DIAGNOSIS — N183 Chronic kidney disease, stage 3 unspecified: Secondary | ICD-10-CM | POA: Diagnosis not present

## 2020-04-14 DIAGNOSIS — G030 Nonpyogenic meningitis: Secondary | ICD-10-CM | POA: Diagnosis not present

## 2020-04-14 DIAGNOSIS — M797 Fibromyalgia: Secondary | ICD-10-CM | POA: Diagnosis not present

## 2020-04-14 DIAGNOSIS — R519 Headache, unspecified: Secondary | ICD-10-CM | POA: Diagnosis not present

## 2020-04-14 DIAGNOSIS — H532 Diplopia: Secondary | ICD-10-CM | POA: Diagnosis not present

## 2020-04-14 DIAGNOSIS — R509 Fever, unspecified: Secondary | ICD-10-CM | POA: Diagnosis not present

## 2020-04-14 DIAGNOSIS — E039 Hypothyroidism, unspecified: Secondary | ICD-10-CM | POA: Diagnosis not present

## 2020-04-14 DIAGNOSIS — R1110 Vomiting, unspecified: Secondary | ICD-10-CM | POA: Diagnosis not present

## 2020-04-14 DIAGNOSIS — J449 Chronic obstructive pulmonary disease, unspecified: Secondary | ICD-10-CM | POA: Diagnosis not present

## 2020-04-14 DIAGNOSIS — G7000 Myasthenia gravis without (acute) exacerbation: Secondary | ICD-10-CM | POA: Diagnosis not present

## 2020-04-14 DIAGNOSIS — I130 Hypertensive heart and chronic kidney disease with heart failure and stage 1 through stage 4 chronic kidney disease, or unspecified chronic kidney disease: Secondary | ICD-10-CM | POA: Diagnosis not present

## 2020-04-14 DIAGNOSIS — D72829 Elevated white blood cell count, unspecified: Secondary | ICD-10-CM | POA: Diagnosis not present

## 2020-04-14 DIAGNOSIS — Z5181 Encounter for therapeutic drug level monitoring: Secondary | ICD-10-CM | POA: Diagnosis not present

## 2020-04-14 DIAGNOSIS — G7001 Myasthenia gravis with (acute) exacerbation: Secondary | ICD-10-CM | POA: Diagnosis not present

## 2020-04-14 DIAGNOSIS — R1312 Dysphagia, oropharyngeal phase: Secondary | ICD-10-CM | POA: Diagnosis not present

## 2020-04-14 DIAGNOSIS — G8929 Other chronic pain: Secondary | ICD-10-CM | POA: Diagnosis not present

## 2020-04-14 DIAGNOSIS — I5030 Unspecified diastolic (congestive) heart failure: Secondary | ICD-10-CM | POA: Diagnosis not present

## 2020-04-16 DIAGNOSIS — H532 Diplopia: Secondary | ICD-10-CM | POA: Diagnosis not present

## 2020-04-16 DIAGNOSIS — R29898 Other symptoms and signs involving the musculoskeletal system: Secondary | ICD-10-CM | POA: Diagnosis not present

## 2020-04-16 DIAGNOSIS — G7000 Myasthenia gravis without (acute) exacerbation: Secondary | ICD-10-CM | POA: Diagnosis not present

## 2020-04-16 DIAGNOSIS — R1312 Dysphagia, oropharyngeal phase: Secondary | ICD-10-CM | POA: Diagnosis not present

## 2020-04-18 DIAGNOSIS — R509 Fever, unspecified: Secondary | ICD-10-CM | POA: Diagnosis not present

## 2020-05-03 DIAGNOSIS — G7000 Myasthenia gravis without (acute) exacerbation: Secondary | ICD-10-CM | POA: Diagnosis not present

## 2020-05-03 DIAGNOSIS — Z5181 Encounter for therapeutic drug level monitoring: Secondary | ICD-10-CM | POA: Diagnosis not present

## 2020-05-07 DIAGNOSIS — G7000 Myasthenia gravis without (acute) exacerbation: Secondary | ICD-10-CM | POA: Diagnosis not present

## 2020-05-07 DIAGNOSIS — G7001 Myasthenia gravis with (acute) exacerbation: Secondary | ICD-10-CM | POA: Diagnosis not present

## 2020-05-11 DIAGNOSIS — M79602 Pain in left arm: Secondary | ICD-10-CM | POA: Diagnosis not present

## 2020-05-11 DIAGNOSIS — R531 Weakness: Secondary | ICD-10-CM | POA: Diagnosis not present

## 2020-05-11 DIAGNOSIS — M542 Cervicalgia: Secondary | ICD-10-CM | POA: Diagnosis not present

## 2020-05-11 DIAGNOSIS — M79601 Pain in right arm: Secondary | ICD-10-CM | POA: Diagnosis not present

## 2020-05-11 DIAGNOSIS — G7000 Myasthenia gravis without (acute) exacerbation: Secondary | ICD-10-CM | POA: Diagnosis not present

## 2020-05-11 DIAGNOSIS — R5383 Other fatigue: Secondary | ICD-10-CM | POA: Diagnosis not present

## 2020-05-11 DIAGNOSIS — G7001 Myasthenia gravis with (acute) exacerbation: Secondary | ICD-10-CM | POA: Diagnosis not present

## 2020-05-11 DIAGNOSIS — M79605 Pain in left leg: Secondary | ICD-10-CM | POA: Diagnosis not present

## 2020-05-11 DIAGNOSIS — E1143 Type 2 diabetes mellitus with diabetic autonomic (poly)neuropathy: Secondary | ICD-10-CM | POA: Diagnosis not present

## 2020-05-11 DIAGNOSIS — M79604 Pain in right leg: Secondary | ICD-10-CM | POA: Diagnosis not present

## 2020-05-12 DIAGNOSIS — M4802 Spinal stenosis, cervical region: Secondary | ICD-10-CM | POA: Diagnosis not present

## 2020-05-12 DIAGNOSIS — M542 Cervicalgia: Secondary | ICD-10-CM | POA: Diagnosis not present

## 2020-05-12 DIAGNOSIS — M5412 Radiculopathy, cervical region: Secondary | ICD-10-CM | POA: Diagnosis not present

## 2020-05-13 DIAGNOSIS — E1143 Type 2 diabetes mellitus with diabetic autonomic (poly)neuropathy: Secondary | ICD-10-CM | POA: Diagnosis not present

## 2020-05-13 DIAGNOSIS — G7000 Myasthenia gravis without (acute) exacerbation: Secondary | ICD-10-CM | POA: Diagnosis not present

## 2020-05-13 DIAGNOSIS — R200 Anesthesia of skin: Secondary | ICD-10-CM | POA: Diagnosis not present

## 2020-05-13 DIAGNOSIS — G7001 Myasthenia gravis with (acute) exacerbation: Secondary | ICD-10-CM | POA: Diagnosis not present

## 2020-05-13 DIAGNOSIS — K3184 Gastroparesis: Secondary | ICD-10-CM | POA: Diagnosis not present

## 2020-05-13 DIAGNOSIS — R5383 Other fatigue: Secondary | ICD-10-CM | POA: Diagnosis not present

## 2020-05-13 DIAGNOSIS — M810 Age-related osteoporosis without current pathological fracture: Secondary | ICD-10-CM | POA: Diagnosis not present

## 2020-05-13 DIAGNOSIS — Z95828 Presence of other vascular implants and grafts: Secondary | ICD-10-CM | POA: Diagnosis not present

## 2020-05-13 DIAGNOSIS — G4733 Obstructive sleep apnea (adult) (pediatric): Secondary | ICD-10-CM | POA: Diagnosis not present

## 2020-05-13 DIAGNOSIS — K219 Gastro-esophageal reflux disease without esophagitis: Secondary | ICD-10-CM | POA: Diagnosis not present

## 2020-05-13 DIAGNOSIS — R9389 Abnormal findings on diagnostic imaging of other specified body structures: Secondary | ICD-10-CM | POA: Diagnosis not present

## 2020-05-17 DIAGNOSIS — K219 Gastro-esophageal reflux disease without esophagitis: Secondary | ICD-10-CM | POA: Diagnosis not present

## 2020-05-17 DIAGNOSIS — G4733 Obstructive sleep apnea (adult) (pediatric): Secondary | ICD-10-CM | POA: Diagnosis not present

## 2020-05-17 DIAGNOSIS — G7001 Myasthenia gravis with (acute) exacerbation: Secondary | ICD-10-CM | POA: Diagnosis not present

## 2020-05-17 DIAGNOSIS — R5383 Other fatigue: Secondary | ICD-10-CM | POA: Diagnosis not present

## 2020-05-17 DIAGNOSIS — E1143 Type 2 diabetes mellitus with diabetic autonomic (poly)neuropathy: Secondary | ICD-10-CM | POA: Diagnosis not present

## 2020-05-17 DIAGNOSIS — M810 Age-related osteoporosis without current pathological fracture: Secondary | ICD-10-CM | POA: Diagnosis not present

## 2020-05-17 DIAGNOSIS — K3184 Gastroparesis: Secondary | ICD-10-CM | POA: Diagnosis not present

## 2020-05-18 DIAGNOSIS — R200 Anesthesia of skin: Secondary | ICD-10-CM | POA: Diagnosis not present

## 2020-05-18 DIAGNOSIS — R002 Palpitations: Secondary | ICD-10-CM | POA: Diagnosis not present

## 2020-05-18 DIAGNOSIS — R079 Chest pain, unspecified: Secondary | ICD-10-CM | POA: Diagnosis not present

## 2020-05-18 DIAGNOSIS — R5383 Other fatigue: Secondary | ICD-10-CM | POA: Diagnosis not present

## 2020-05-18 DIAGNOSIS — R0789 Other chest pain: Secondary | ICD-10-CM | POA: Diagnosis not present

## 2020-05-18 DIAGNOSIS — K5900 Constipation, unspecified: Secondary | ICD-10-CM | POA: Diagnosis not present

## 2020-05-18 DIAGNOSIS — R110 Nausea: Secondary | ICD-10-CM | POA: Diagnosis not present

## 2020-05-18 DIAGNOSIS — R531 Weakness: Secondary | ICD-10-CM | POA: Diagnosis not present

## 2020-05-18 DIAGNOSIS — R9431 Abnormal electrocardiogram [ECG] [EKG]: Secondary | ICD-10-CM | POA: Diagnosis not present

## 2020-05-18 DIAGNOSIS — R0602 Shortness of breath: Secondary | ICD-10-CM | POA: Diagnosis not present

## 2020-05-20 DIAGNOSIS — E785 Hyperlipidemia, unspecified: Secondary | ICD-10-CM | POA: Diagnosis not present

## 2020-05-20 DIAGNOSIS — G8929 Other chronic pain: Secondary | ICD-10-CM | POA: Diagnosis not present

## 2020-05-20 DIAGNOSIS — J449 Chronic obstructive pulmonary disease, unspecified: Secondary | ICD-10-CM | POA: Diagnosis not present

## 2020-05-20 DIAGNOSIS — E039 Hypothyroidism, unspecified: Secondary | ICD-10-CM | POA: Diagnosis not present

## 2020-05-20 DIAGNOSIS — K3184 Gastroparesis: Secondary | ICD-10-CM | POA: Diagnosis not present

## 2020-05-20 DIAGNOSIS — M797 Fibromyalgia: Secondary | ICD-10-CM | POA: Diagnosis not present

## 2020-05-20 DIAGNOSIS — D649 Anemia, unspecified: Secondary | ICD-10-CM | POA: Diagnosis not present

## 2020-05-20 DIAGNOSIS — I10 Essential (primary) hypertension: Secondary | ICD-10-CM | POA: Diagnosis not present

## 2020-05-20 DIAGNOSIS — E1143 Type 2 diabetes mellitus with diabetic autonomic (poly)neuropathy: Secondary | ICD-10-CM | POA: Diagnosis not present

## 2020-05-20 DIAGNOSIS — R600 Localized edema: Secondary | ICD-10-CM | POA: Diagnosis not present

## 2020-05-20 DIAGNOSIS — R531 Weakness: Secondary | ICD-10-CM | POA: Diagnosis not present

## 2020-05-20 DIAGNOSIS — E1165 Type 2 diabetes mellitus with hyperglycemia: Secondary | ICD-10-CM | POA: Diagnosis not present

## 2020-05-20 DIAGNOSIS — G7000 Myasthenia gravis without (acute) exacerbation: Secondary | ICD-10-CM | POA: Diagnosis not present

## 2020-05-21 DIAGNOSIS — G7001 Myasthenia gravis with (acute) exacerbation: Secondary | ICD-10-CM | POA: Diagnosis not present

## 2020-05-23 DIAGNOSIS — I493 Ventricular premature depolarization: Secondary | ICD-10-CM | POA: Diagnosis not present

## 2020-05-23 DIAGNOSIS — I471 Supraventricular tachycardia: Secondary | ICD-10-CM | POA: Diagnosis not present

## 2020-05-23 DIAGNOSIS — R079 Chest pain, unspecified: Secondary | ICD-10-CM | POA: Diagnosis not present

## 2020-05-24 DIAGNOSIS — G7001 Myasthenia gravis with (acute) exacerbation: Secondary | ICD-10-CM | POA: Diagnosis not present

## 2020-05-24 DIAGNOSIS — Z452 Encounter for adjustment and management of vascular access device: Secondary | ICD-10-CM | POA: Diagnosis not present

## 2020-06-11 DIAGNOSIS — D509 Iron deficiency anemia, unspecified: Secondary | ICD-10-CM | POA: Diagnosis not present

## 2020-06-11 DIAGNOSIS — G7000 Myasthenia gravis without (acute) exacerbation: Secondary | ICD-10-CM | POA: Diagnosis not present

## 2020-06-21 ENCOUNTER — Other Ambulatory Visit: Payer: Self-pay | Admitting: Internal Medicine

## 2020-06-21 DIAGNOSIS — I10 Essential (primary) hypertension: Secondary | ICD-10-CM | POA: Diagnosis not present

## 2020-06-21 DIAGNOSIS — K3184 Gastroparesis: Secondary | ICD-10-CM | POA: Diagnosis not present

## 2020-06-21 DIAGNOSIS — N289 Disorder of kidney and ureter, unspecified: Secondary | ICD-10-CM | POA: Diagnosis not present

## 2020-06-21 DIAGNOSIS — G7000 Myasthenia gravis without (acute) exacerbation: Secondary | ICD-10-CM | POA: Diagnosis not present

## 2020-06-21 DIAGNOSIS — E039 Hypothyroidism, unspecified: Secondary | ICD-10-CM | POA: Diagnosis not present

## 2020-06-21 DIAGNOSIS — R221 Localized swelling, mass and lump, neck: Secondary | ICD-10-CM

## 2020-06-21 DIAGNOSIS — E538 Deficiency of other specified B group vitamins: Secondary | ICD-10-CM | POA: Diagnosis not present

## 2020-06-21 DIAGNOSIS — E1143 Type 2 diabetes mellitus with diabetic autonomic (poly)neuropathy: Secondary | ICD-10-CM | POA: Diagnosis not present

## 2020-06-21 DIAGNOSIS — M797 Fibromyalgia: Secondary | ICD-10-CM | POA: Diagnosis not present

## 2020-06-21 DIAGNOSIS — D649 Anemia, unspecified: Secondary | ICD-10-CM | POA: Diagnosis not present

## 2020-06-25 DIAGNOSIS — R937 Abnormal findings on diagnostic imaging of other parts of musculoskeletal system: Secondary | ICD-10-CM | POA: Diagnosis not present

## 2020-06-25 DIAGNOSIS — G7000 Myasthenia gravis without (acute) exacerbation: Secondary | ICD-10-CM | POA: Diagnosis not present

## 2020-06-25 DIAGNOSIS — Z79899 Other long term (current) drug therapy: Secondary | ICD-10-CM | POA: Diagnosis not present

## 2020-06-25 DIAGNOSIS — M4312 Spondylolisthesis, cervical region: Secondary | ICD-10-CM | POA: Diagnosis not present

## 2020-07-02 DIAGNOSIS — E119 Type 2 diabetes mellitus without complications: Secondary | ICD-10-CM | POA: Diagnosis not present

## 2020-07-02 DIAGNOSIS — G4733 Obstructive sleep apnea (adult) (pediatric): Secondary | ICD-10-CM | POA: Diagnosis not present

## 2020-07-02 DIAGNOSIS — M810 Age-related osteoporosis without current pathological fracture: Secondary | ICD-10-CM | POA: Diagnosis not present

## 2020-07-02 DIAGNOSIS — R0602 Shortness of breath: Secondary | ICD-10-CM | POA: Diagnosis not present

## 2020-07-02 DIAGNOSIS — R531 Weakness: Secondary | ICD-10-CM | POA: Diagnosis not present

## 2020-07-02 DIAGNOSIS — R221 Localized swelling, mass and lump, neck: Secondary | ICD-10-CM | POA: Diagnosis not present

## 2020-07-02 DIAGNOSIS — K219 Gastro-esophageal reflux disease without esophagitis: Secondary | ICD-10-CM | POA: Diagnosis not present

## 2020-07-02 DIAGNOSIS — M79606 Pain in leg, unspecified: Secondary | ICD-10-CM | POA: Diagnosis not present

## 2020-07-02 DIAGNOSIS — G7000 Myasthenia gravis without (acute) exacerbation: Secondary | ICD-10-CM | POA: Diagnosis not present

## 2020-07-05 DIAGNOSIS — G7000 Myasthenia gravis without (acute) exacerbation: Secondary | ICD-10-CM | POA: Diagnosis not present

## 2020-07-15 DIAGNOSIS — R9389 Abnormal findings on diagnostic imaging of other specified body structures: Secondary | ICD-10-CM | POA: Diagnosis not present

## 2020-07-30 DIAGNOSIS — I10 Essential (primary) hypertension: Secondary | ICD-10-CM | POA: Diagnosis not present

## 2020-07-30 DIAGNOSIS — R0789 Other chest pain: Secondary | ICD-10-CM | POA: Diagnosis not present

## 2020-08-06 DIAGNOSIS — G7000 Myasthenia gravis without (acute) exacerbation: Secondary | ICD-10-CM | POA: Diagnosis not present

## 2020-08-08 DIAGNOSIS — G7000 Myasthenia gravis without (acute) exacerbation: Secondary | ICD-10-CM | POA: Diagnosis not present

## 2020-08-09 DIAGNOSIS — G7000 Myasthenia gravis without (acute) exacerbation: Secondary | ICD-10-CM | POA: Diagnosis not present

## 2020-08-10 DIAGNOSIS — G7000 Myasthenia gravis without (acute) exacerbation: Secondary | ICD-10-CM | POA: Diagnosis not present

## 2020-08-11 DIAGNOSIS — G7000 Myasthenia gravis without (acute) exacerbation: Secondary | ICD-10-CM | POA: Diagnosis not present

## 2020-08-12 DIAGNOSIS — E039 Hypothyroidism, unspecified: Secondary | ICD-10-CM | POA: Diagnosis not present

## 2020-08-12 DIAGNOSIS — K3184 Gastroparesis: Secondary | ICD-10-CM | POA: Diagnosis not present

## 2020-08-12 DIAGNOSIS — J449 Chronic obstructive pulmonary disease, unspecified: Secondary | ICD-10-CM | POA: Diagnosis not present

## 2020-08-12 DIAGNOSIS — E1165 Type 2 diabetes mellitus with hyperglycemia: Secondary | ICD-10-CM | POA: Diagnosis not present

## 2020-08-12 DIAGNOSIS — R600 Localized edema: Secondary | ICD-10-CM | POA: Diagnosis not present

## 2020-08-12 DIAGNOSIS — D649 Anemia, unspecified: Secondary | ICD-10-CM | POA: Diagnosis not present

## 2020-08-12 DIAGNOSIS — G7000 Myasthenia gravis without (acute) exacerbation: Secondary | ICD-10-CM | POA: Diagnosis not present

## 2020-08-12 DIAGNOSIS — R531 Weakness: Secondary | ICD-10-CM | POA: Diagnosis not present

## 2020-08-12 DIAGNOSIS — I10 Essential (primary) hypertension: Secondary | ICD-10-CM | POA: Diagnosis not present

## 2020-08-17 DIAGNOSIS — E119 Type 2 diabetes mellitus without complications: Secondary | ICD-10-CM | POA: Diagnosis not present

## 2020-08-17 DIAGNOSIS — I10 Essential (primary) hypertension: Secondary | ICD-10-CM | POA: Diagnosis not present

## 2020-08-17 DIAGNOSIS — M797 Fibromyalgia: Secondary | ICD-10-CM | POA: Diagnosis not present

## 2020-08-17 DIAGNOSIS — G7000 Myasthenia gravis without (acute) exacerbation: Secondary | ICD-10-CM | POA: Diagnosis not present

## 2020-08-17 DIAGNOSIS — Z0000 Encounter for general adult medical examination without abnormal findings: Secondary | ICD-10-CM | POA: Diagnosis not present

## 2020-08-17 DIAGNOSIS — E538 Deficiency of other specified B group vitamins: Secondary | ICD-10-CM | POA: Diagnosis not present

## 2020-08-17 DIAGNOSIS — G4733 Obstructive sleep apnea (adult) (pediatric): Secondary | ICD-10-CM | POA: Diagnosis not present

## 2020-08-17 DIAGNOSIS — E039 Hypothyroidism, unspecified: Secondary | ICD-10-CM | POA: Diagnosis not present

## 2020-08-17 DIAGNOSIS — K219 Gastro-esophageal reflux disease without esophagitis: Secondary | ICD-10-CM | POA: Diagnosis not present

## 2020-08-20 DIAGNOSIS — I10 Essential (primary) hypertension: Secondary | ICD-10-CM | POA: Diagnosis not present

## 2020-08-20 DIAGNOSIS — R0789 Other chest pain: Secondary | ICD-10-CM | POA: Diagnosis not present

## 2020-08-24 DIAGNOSIS — G43019 Migraine without aura, intractable, without status migrainosus: Secondary | ICD-10-CM | POA: Diagnosis not present

## 2020-08-24 DIAGNOSIS — H532 Diplopia: Secondary | ICD-10-CM | POA: Diagnosis not present

## 2020-08-27 DIAGNOSIS — R531 Weakness: Secondary | ICD-10-CM | POA: Diagnosis not present

## 2020-08-27 DIAGNOSIS — H532 Diplopia: Secondary | ICD-10-CM | POA: Diagnosis not present

## 2020-08-27 DIAGNOSIS — R1310 Dysphagia, unspecified: Secondary | ICD-10-CM | POA: Diagnosis not present

## 2020-08-27 DIAGNOSIS — G7000 Myasthenia gravis without (acute) exacerbation: Secondary | ICD-10-CM | POA: Diagnosis not present

## 2020-08-27 DIAGNOSIS — H05829 Myopathy of extraocular muscles, unspecified orbit: Secondary | ICD-10-CM | POA: Diagnosis not present

## 2020-08-27 DIAGNOSIS — H05823 Myopathy of extraocular muscles, bilateral: Secondary | ICD-10-CM | POA: Diagnosis not present

## 2020-09-07 DIAGNOSIS — N1831 Chronic kidney disease, stage 3a: Secondary | ICD-10-CM | POA: Diagnosis not present

## 2020-09-07 DIAGNOSIS — R350 Frequency of micturition: Secondary | ICD-10-CM | POA: Diagnosis not present

## 2020-09-07 DIAGNOSIS — I10 Essential (primary) hypertension: Secondary | ICD-10-CM | POA: Diagnosis not present

## 2020-09-13 DIAGNOSIS — G7000 Myasthenia gravis without (acute) exacerbation: Secondary | ICD-10-CM | POA: Diagnosis not present

## 2020-09-14 DIAGNOSIS — G7000 Myasthenia gravis without (acute) exacerbation: Secondary | ICD-10-CM | POA: Diagnosis not present

## 2020-09-18 DIAGNOSIS — G7000 Myasthenia gravis without (acute) exacerbation: Secondary | ICD-10-CM | POA: Diagnosis not present

## 2020-09-19 DIAGNOSIS — G7000 Myasthenia gravis without (acute) exacerbation: Secondary | ICD-10-CM | POA: Diagnosis not present

## 2020-09-29 DIAGNOSIS — R1312 Dysphagia, oropharyngeal phase: Secondary | ICD-10-CM | POA: Diagnosis not present

## 2020-09-29 DIAGNOSIS — R1310 Dysphagia, unspecified: Secondary | ICD-10-CM | POA: Diagnosis not present

## 2020-09-29 DIAGNOSIS — R399 Unspecified symptoms and signs involving the genitourinary system: Secondary | ICD-10-CM | POA: Diagnosis not present

## 2020-10-05 ENCOUNTER — Other Ambulatory Visit: Payer: Self-pay | Admitting: Nephrology

## 2020-10-05 ENCOUNTER — Other Ambulatory Visit (HOSPITAL_COMMUNITY): Payer: Self-pay | Admitting: Nephrology

## 2020-10-05 DIAGNOSIS — R109 Unspecified abdominal pain: Secondary | ICD-10-CM

## 2020-10-14 ENCOUNTER — Other Ambulatory Visit: Payer: Self-pay

## 2020-10-14 ENCOUNTER — Ambulatory Visit (HOSPITAL_COMMUNITY)
Admission: RE | Admit: 2020-10-14 | Discharge: 2020-10-14 | Disposition: A | Discharge status: Home / Self Care | Payer: Medicare HMO | Source: Ambulatory Visit | Attending: Nephrology | Admitting: Nephrology

## 2020-10-14 DIAGNOSIS — R109 Unspecified abdominal pain: Secondary | ICD-10-CM | POA: Diagnosis not present

## 2020-10-14 DIAGNOSIS — N281 Cyst of kidney, acquired: Secondary | ICD-10-CM | POA: Diagnosis not present

## 2020-10-18 DIAGNOSIS — G7000 Myasthenia gravis without (acute) exacerbation: Secondary | ICD-10-CM | POA: Diagnosis not present

## 2020-10-24 DIAGNOSIS — G7000 Myasthenia gravis without (acute) exacerbation: Secondary | ICD-10-CM | POA: Diagnosis not present

## 2020-10-28 DIAGNOSIS — E1143 Type 2 diabetes mellitus with diabetic autonomic (poly)neuropathy: Secondary | ICD-10-CM | POA: Diagnosis not present

## 2020-10-28 DIAGNOSIS — K219 Gastro-esophageal reflux disease without esophagitis: Secondary | ICD-10-CM | POA: Diagnosis not present

## 2020-10-28 DIAGNOSIS — R531 Weakness: Secondary | ICD-10-CM | POA: Diagnosis not present

## 2020-10-28 DIAGNOSIS — G4733 Obstructive sleep apnea (adult) (pediatric): Secondary | ICD-10-CM | POA: Diagnosis not present

## 2020-10-28 DIAGNOSIS — Z5181 Encounter for therapeutic drug level monitoring: Secondary | ICD-10-CM | POA: Diagnosis not present

## 2020-10-28 DIAGNOSIS — R109 Unspecified abdominal pain: Secondary | ICD-10-CM | POA: Diagnosis not present

## 2020-10-28 DIAGNOSIS — M810 Age-related osteoporosis without current pathological fracture: Secondary | ICD-10-CM | POA: Diagnosis not present

## 2020-10-28 DIAGNOSIS — G7000 Myasthenia gravis without (acute) exacerbation: Secondary | ICD-10-CM | POA: Diagnosis not present

## 2020-10-28 DIAGNOSIS — R1310 Dysphagia, unspecified: Secondary | ICD-10-CM | POA: Diagnosis not present

## 2020-10-28 DIAGNOSIS — K3184 Gastroparesis: Secondary | ICD-10-CM | POA: Diagnosis not present

## 2020-10-29 DIAGNOSIS — R0789 Other chest pain: Secondary | ICD-10-CM | POA: Diagnosis not present

## 2020-11-10 DIAGNOSIS — J069 Acute upper respiratory infection, unspecified: Secondary | ICD-10-CM | POA: Diagnosis not present

## 2020-11-12 DIAGNOSIS — M159 Polyosteoarthritis, unspecified: Secondary | ICD-10-CM | POA: Diagnosis not present

## 2020-11-12 DIAGNOSIS — E039 Hypothyroidism, unspecified: Secondary | ICD-10-CM | POA: Diagnosis not present

## 2020-11-12 DIAGNOSIS — I10 Essential (primary) hypertension: Secondary | ICD-10-CM | POA: Diagnosis not present

## 2020-11-12 DIAGNOSIS — E538 Deficiency of other specified B group vitamins: Secondary | ICD-10-CM | POA: Diagnosis not present

## 2020-11-12 DIAGNOSIS — J069 Acute upper respiratory infection, unspecified: Secondary | ICD-10-CM | POA: Diagnosis not present

## 2020-11-12 DIAGNOSIS — R7309 Other abnormal glucose: Secondary | ICD-10-CM | POA: Diagnosis not present

## 2020-11-12 DIAGNOSIS — G4733 Obstructive sleep apnea (adult) (pediatric): Secondary | ICD-10-CM | POA: Diagnosis not present

## 2020-11-12 DIAGNOSIS — M797 Fibromyalgia: Secondary | ICD-10-CM | POA: Diagnosis not present

## 2020-11-12 DIAGNOSIS — K219 Gastro-esophageal reflux disease without esophagitis: Secondary | ICD-10-CM | POA: Diagnosis not present

## 2020-11-12 DIAGNOSIS — G7000 Myasthenia gravis without (acute) exacerbation: Secondary | ICD-10-CM | POA: Diagnosis not present

## 2020-11-14 DIAGNOSIS — R059 Cough, unspecified: Secondary | ICD-10-CM | POA: Diagnosis not present

## 2020-11-15 DIAGNOSIS — J069 Acute upper respiratory infection, unspecified: Secondary | ICD-10-CM | POA: Diagnosis not present

## 2020-11-15 DIAGNOSIS — G7000 Myasthenia gravis without (acute) exacerbation: Secondary | ICD-10-CM | POA: Diagnosis not present

## 2020-11-15 DIAGNOSIS — J40 Bronchitis, not specified as acute or chronic: Secondary | ICD-10-CM | POA: Diagnosis not present

## 2020-11-18 DIAGNOSIS — G7000 Myasthenia gravis without (acute) exacerbation: Secondary | ICD-10-CM | POA: Diagnosis not present

## 2020-11-18 DIAGNOSIS — J449 Chronic obstructive pulmonary disease, unspecified: Secondary | ICD-10-CM | POA: Diagnosis not present

## 2020-11-18 DIAGNOSIS — Z0000 Encounter for general adult medical examination without abnormal findings: Secondary | ICD-10-CM | POA: Diagnosis not present

## 2020-11-18 DIAGNOSIS — Z8709 Personal history of other diseases of the respiratory system: Secondary | ICD-10-CM | POA: Diagnosis not present

## 2020-11-18 DIAGNOSIS — E1165 Type 2 diabetes mellitus with hyperglycemia: Secondary | ICD-10-CM | POA: Diagnosis not present

## 2020-11-18 DIAGNOSIS — I10 Essential (primary) hypertension: Secondary | ICD-10-CM | POA: Diagnosis not present

## 2020-11-18 DIAGNOSIS — R059 Cough, unspecified: Secondary | ICD-10-CM | POA: Diagnosis not present

## 2020-11-18 DIAGNOSIS — R053 Chronic cough: Secondary | ICD-10-CM | POA: Diagnosis not present

## 2020-11-18 DIAGNOSIS — G43011 Migraine without aura, intractable, with status migrainosus: Secondary | ICD-10-CM | POA: Diagnosis not present

## 2020-11-18 DIAGNOSIS — E119 Type 2 diabetes mellitus without complications: Secondary | ICD-10-CM | POA: Diagnosis not present

## 2020-11-18 DIAGNOSIS — D649 Anemia, unspecified: Secondary | ICD-10-CM | POA: Diagnosis not present

## 2020-11-18 DIAGNOSIS — J209 Acute bronchitis, unspecified: Secondary | ICD-10-CM | POA: Diagnosis not present

## 2020-12-03 DIAGNOSIS — H4943 Progressive external ophthalmoplegia, bilateral: Secondary | ICD-10-CM | POA: Diagnosis not present

## 2020-12-03 DIAGNOSIS — H5022 Vertical strabismus, left eye: Secondary | ICD-10-CM | POA: Diagnosis not present

## 2020-12-03 DIAGNOSIS — H532 Diplopia: Secondary | ICD-10-CM | POA: Diagnosis not present

## 2020-12-03 DIAGNOSIS — H5111 Convergence insufficiency: Secondary | ICD-10-CM | POA: Diagnosis not present

## 2020-12-03 DIAGNOSIS — G7000 Myasthenia gravis without (acute) exacerbation: Secondary | ICD-10-CM | POA: Diagnosis not present

## 2020-12-04 DIAGNOSIS — H52209 Unspecified astigmatism, unspecified eye: Secondary | ICD-10-CM | POA: Diagnosis not present

## 2020-12-04 DIAGNOSIS — H5203 Hypermetropia, bilateral: Secondary | ICD-10-CM | POA: Diagnosis not present

## 2020-12-14 DIAGNOSIS — R1312 Dysphagia, oropharyngeal phase: Secondary | ICD-10-CM | POA: Diagnosis not present

## 2020-12-15 DIAGNOSIS — R1906 Epigastric swelling, mass or lump: Secondary | ICD-10-CM | POA: Diagnosis not present

## 2020-12-15 DIAGNOSIS — R1013 Epigastric pain: Secondary | ICD-10-CM | POA: Diagnosis not present

## 2020-12-15 DIAGNOSIS — R112 Nausea with vomiting, unspecified: Secondary | ICD-10-CM | POA: Diagnosis not present

## 2020-12-16 ENCOUNTER — Other Ambulatory Visit: Payer: Self-pay | Admitting: Family Medicine

## 2020-12-16 DIAGNOSIS — R1013 Epigastric pain: Secondary | ICD-10-CM

## 2020-12-23 DIAGNOSIS — R1013 Epigastric pain: Secondary | ICD-10-CM | POA: Diagnosis not present

## 2020-12-23 DIAGNOSIS — R112 Nausea with vomiting, unspecified: Secondary | ICD-10-CM | POA: Diagnosis not present

## 2020-12-23 DIAGNOSIS — R1906 Epigastric swelling, mass or lump: Secondary | ICD-10-CM | POA: Diagnosis not present

## 2020-12-29 DIAGNOSIS — G7000 Myasthenia gravis without (acute) exacerbation: Secondary | ICD-10-CM | POA: Diagnosis not present

## 2020-12-29 DIAGNOSIS — R222 Localized swelling, mass and lump, trunk: Secondary | ICD-10-CM | POA: Diagnosis not present

## 2020-12-29 DIAGNOSIS — M898X8 Other specified disorders of bone, other site: Secondary | ICD-10-CM | POA: Diagnosis not present

## 2021-01-03 ENCOUNTER — Ambulatory Visit: Payer: Medicare HMO

## 2021-01-06 DIAGNOSIS — M797 Fibromyalgia: Secondary | ICD-10-CM | POA: Diagnosis not present

## 2021-01-06 DIAGNOSIS — R0789 Other chest pain: Secondary | ICD-10-CM | POA: Diagnosis not present

## 2021-01-13 DIAGNOSIS — E039 Hypothyroidism, unspecified: Secondary | ICD-10-CM | POA: Diagnosis not present

## 2021-01-13 DIAGNOSIS — K3184 Gastroparesis: Secondary | ICD-10-CM | POA: Diagnosis not present

## 2021-01-13 DIAGNOSIS — G4733 Obstructive sleep apnea (adult) (pediatric): Secondary | ICD-10-CM | POA: Diagnosis not present

## 2021-01-13 DIAGNOSIS — G7000 Myasthenia gravis without (acute) exacerbation: Secondary | ICD-10-CM | POA: Diagnosis not present

## 2021-01-13 DIAGNOSIS — E8840 Mitochondrial metabolism disorder, unspecified: Secondary | ICD-10-CM | POA: Diagnosis not present

## 2021-01-13 DIAGNOSIS — G43011 Migraine without aura, intractable, with status migrainosus: Secondary | ICD-10-CM | POA: Diagnosis not present

## 2021-01-13 DIAGNOSIS — M797 Fibromyalgia: Secondary | ICD-10-CM | POA: Diagnosis not present

## 2021-01-13 DIAGNOSIS — R0789 Other chest pain: Secondary | ICD-10-CM | POA: Diagnosis not present

## 2021-01-13 DIAGNOSIS — E1122 Type 2 diabetes mellitus with diabetic chronic kidney disease: Secondary | ICD-10-CM | POA: Diagnosis not present

## 2021-01-13 DIAGNOSIS — I129 Hypertensive chronic kidney disease with stage 1 through stage 4 chronic kidney disease, or unspecified chronic kidney disease: Secondary | ICD-10-CM | POA: Diagnosis not present

## 2021-01-19 ENCOUNTER — Other Ambulatory Visit: Payer: Self-pay | Admitting: Physical Medicine & Rehabilitation

## 2021-01-19 DIAGNOSIS — M542 Cervicalgia: Secondary | ICD-10-CM | POA: Diagnosis not present

## 2021-01-19 DIAGNOSIS — M5412 Radiculopathy, cervical region: Secondary | ICD-10-CM | POA: Diagnosis not present

## 2021-01-19 DIAGNOSIS — M9931 Osseous stenosis of neural canal of cervical region: Secondary | ICD-10-CM | POA: Diagnosis not present

## 2021-01-24 ENCOUNTER — Other Ambulatory Visit: Payer: Self-pay

## 2021-01-24 ENCOUNTER — Ambulatory Visit
Admission: RE | Admit: 2021-01-24 | Discharge: 2021-01-24 | Disposition: A | Discharge status: Home / Self Care | Payer: Medicare HMO | Source: Ambulatory Visit | Attending: Physical Medicine & Rehabilitation | Admitting: Physical Medicine & Rehabilitation

## 2021-01-24 DIAGNOSIS — M542 Cervicalgia: Secondary | ICD-10-CM | POA: Diagnosis not present

## 2021-01-24 MED ORDER — TRIAMCINOLONE ACETONIDE 40 MG/ML IJ SUSP (RADIOLOGY)
60.0000 mg | Freq: Once | INTRAMUSCULAR | Status: AC
  Administered 2021-01-24: 60 mg via EPIDURAL

## 2021-01-24 MED ORDER — IOPAMIDOL (ISOVUE-M 300) INJECTION 61%
1.0000 mL | Freq: Once | INTRAMUSCULAR | Status: AC | PRN
  Administered 2021-01-24: 1 mL via EPIDURAL

## 2021-01-24 NOTE — Discharge Instructions (Addendum)

## 2021-02-01 DIAGNOSIS — L659 Nonscarring hair loss, unspecified: Secondary | ICD-10-CM | POA: Diagnosis not present

## 2021-02-01 DIAGNOSIS — L648 Other androgenic alopecia: Secondary | ICD-10-CM | POA: Diagnosis not present

## 2021-02-01 DIAGNOSIS — Z79899 Other long term (current) drug therapy: Secondary | ICD-10-CM | POA: Diagnosis not present

## 2021-02-01 DIAGNOSIS — D485 Neoplasm of uncertain behavior of skin: Secondary | ICD-10-CM | POA: Diagnosis not present

## 2021-02-08 DIAGNOSIS — M542 Cervicalgia: Secondary | ICD-10-CM | POA: Diagnosis not present

## 2021-02-08 DIAGNOSIS — M9931 Osseous stenosis of neural canal of cervical region: Secondary | ICD-10-CM | POA: Diagnosis not present

## 2021-02-08 DIAGNOSIS — M5412 Radiculopathy, cervical region: Secondary | ICD-10-CM | POA: Diagnosis not present

## 2021-02-09 DIAGNOSIS — R0789 Other chest pain: Secondary | ICD-10-CM | POA: Diagnosis not present

## 2021-02-18 DIAGNOSIS — G43011 Migraine without aura, intractable, with status migrainosus: Secondary | ICD-10-CM | POA: Diagnosis not present

## 2021-02-18 DIAGNOSIS — D649 Anemia, unspecified: Secondary | ICD-10-CM | POA: Diagnosis not present

## 2021-02-18 DIAGNOSIS — R053 Chronic cough: Secondary | ICD-10-CM | POA: Diagnosis not present

## 2021-02-18 DIAGNOSIS — J209 Acute bronchitis, unspecified: Secondary | ICD-10-CM | POA: Diagnosis not present

## 2021-02-18 DIAGNOSIS — E1165 Type 2 diabetes mellitus with hyperglycemia: Secondary | ICD-10-CM | POA: Diagnosis not present

## 2021-02-18 DIAGNOSIS — G7000 Myasthenia gravis without (acute) exacerbation: Secondary | ICD-10-CM | POA: Diagnosis not present

## 2021-02-18 DIAGNOSIS — I10 Essential (primary) hypertension: Secondary | ICD-10-CM | POA: Diagnosis not present

## 2021-02-18 DIAGNOSIS — J449 Chronic obstructive pulmonary disease, unspecified: Secondary | ICD-10-CM | POA: Diagnosis not present

## 2021-02-24 DIAGNOSIS — E1143 Type 2 diabetes mellitus with diabetic autonomic (poly)neuropathy: Secondary | ICD-10-CM | POA: Diagnosis not present

## 2021-02-24 DIAGNOSIS — I10 Essential (primary) hypertension: Secondary | ICD-10-CM | POA: Diagnosis not present

## 2021-02-24 DIAGNOSIS — R0789 Other chest pain: Secondary | ICD-10-CM | POA: Diagnosis not present

## 2021-02-24 DIAGNOSIS — E039 Hypothyroidism, unspecified: Secondary | ICD-10-CM | POA: Diagnosis not present

## 2021-02-24 DIAGNOSIS — G43909 Migraine, unspecified, not intractable, without status migrainosus: Secondary | ICD-10-CM | POA: Diagnosis not present

## 2021-02-24 DIAGNOSIS — G7000 Myasthenia gravis without (acute) exacerbation: Secondary | ICD-10-CM | POA: Diagnosis not present

## 2021-03-09 DIAGNOSIS — Z20822 Contact with and (suspected) exposure to covid-19: Secondary | ICD-10-CM | POA: Diagnosis not present

## 2021-03-09 DIAGNOSIS — R509 Fever, unspecified: Secondary | ICD-10-CM | POA: Diagnosis not present

## 2021-03-10 DIAGNOSIS — G7000 Myasthenia gravis without (acute) exacerbation: Secondary | ICD-10-CM | POA: Diagnosis not present

## 2021-03-10 DIAGNOSIS — M069 Rheumatoid arthritis, unspecified: Secondary | ICD-10-CM | POA: Diagnosis not present

## 2021-03-17 DIAGNOSIS — Z79899 Other long term (current) drug therapy: Secondary | ICD-10-CM | POA: Diagnosis not present

## 2021-03-17 DIAGNOSIS — L700 Acne vulgaris: Secondary | ICD-10-CM | POA: Diagnosis not present

## 2021-03-17 DIAGNOSIS — L648 Other androgenic alopecia: Secondary | ICD-10-CM | POA: Diagnosis not present

## 2021-03-25 DIAGNOSIS — R531 Weakness: Secondary | ICD-10-CM | POA: Diagnosis not present

## 2021-03-25 DIAGNOSIS — G729 Myopathy, unspecified: Secondary | ICD-10-CM | POA: Diagnosis not present

## 2021-03-25 DIAGNOSIS — G7289 Other specified myopathies: Secondary | ICD-10-CM | POA: Diagnosis not present

## 2021-04-04 DIAGNOSIS — L03114 Cellulitis of left upper limb: Secondary | ICD-10-CM | POA: Diagnosis not present

## 2021-04-04 DIAGNOSIS — T148XXA Other injury of unspecified body region, initial encounter: Secondary | ICD-10-CM | POA: Diagnosis not present

## 2021-04-04 DIAGNOSIS — M96841 Postprocedural hematoma of a musculoskeletal structure following other procedure: Secondary | ICD-10-CM | POA: Diagnosis not present

## 2021-04-04 DIAGNOSIS — M96840 Postprocedural hematoma of a musculoskeletal structure following a musculoskeletal system procedure: Secondary | ICD-10-CM | POA: Diagnosis not present

## 2021-04-07 DIAGNOSIS — E1143 Type 2 diabetes mellitus with diabetic autonomic (poly)neuropathy: Secondary | ICD-10-CM | POA: Diagnosis not present

## 2021-04-07 DIAGNOSIS — E039 Hypothyroidism, unspecified: Secondary | ICD-10-CM | POA: Diagnosis not present

## 2021-04-07 DIAGNOSIS — G43909 Migraine, unspecified, not intractable, without status migrainosus: Secondary | ICD-10-CM | POA: Diagnosis not present

## 2021-04-07 DIAGNOSIS — R0789 Other chest pain: Secondary | ICD-10-CM | POA: Diagnosis not present

## 2021-04-07 DIAGNOSIS — M797 Fibromyalgia: Secondary | ICD-10-CM | POA: Diagnosis not present

## 2021-04-07 DIAGNOSIS — E1144 Type 2 diabetes mellitus with diabetic amyotrophy: Secondary | ICD-10-CM | POA: Diagnosis not present

## 2021-04-07 DIAGNOSIS — G4733 Obstructive sleep apnea (adult) (pediatric): Secondary | ICD-10-CM | POA: Diagnosis not present

## 2021-04-07 DIAGNOSIS — I10 Essential (primary) hypertension: Secondary | ICD-10-CM | POA: Diagnosis not present

## 2021-04-13 DIAGNOSIS — R509 Fever, unspecified: Secondary | ICD-10-CM | POA: Diagnosis not present

## 2021-04-13 DIAGNOSIS — M069 Rheumatoid arthritis, unspecified: Secondary | ICD-10-CM | POA: Diagnosis not present

## 2021-04-15 ENCOUNTER — Ambulatory Visit: Payer: Medicare HMO | Admitting: Oncology

## 2021-04-15 ENCOUNTER — Other Ambulatory Visit: Payer: Medicare HMO

## 2021-04-18 DIAGNOSIS — R0789 Other chest pain: Secondary | ICD-10-CM | POA: Diagnosis not present

## 2021-04-18 DIAGNOSIS — R350 Frequency of micturition: Secondary | ICD-10-CM | POA: Diagnosis not present

## 2021-04-18 DIAGNOSIS — H9201 Otalgia, right ear: Secondary | ICD-10-CM | POA: Diagnosis not present

## 2021-04-28 DIAGNOSIS — Z8739 Personal history of other diseases of the musculoskeletal system and connective tissue: Secondary | ICD-10-CM | POA: Diagnosis not present

## 2021-04-28 DIAGNOSIS — R0789 Other chest pain: Secondary | ICD-10-CM | POA: Diagnosis not present

## 2021-05-05 DIAGNOSIS — R531 Weakness: Secondary | ICD-10-CM | POA: Diagnosis not present

## 2021-05-05 DIAGNOSIS — A689 Relapsing fever, unspecified: Secondary | ICD-10-CM | POA: Diagnosis not present

## 2021-05-05 DIAGNOSIS — M069 Rheumatoid arthritis, unspecified: Secondary | ICD-10-CM | POA: Diagnosis not present

## 2021-05-05 DIAGNOSIS — G713 Mitochondrial myopathy, not elsewhere classified: Secondary | ICD-10-CM | POA: Diagnosis not present

## 2021-05-12 DIAGNOSIS — G7000 Myasthenia gravis without (acute) exacerbation: Secondary | ICD-10-CM | POA: Diagnosis not present

## 2021-05-12 DIAGNOSIS — E119 Type 2 diabetes mellitus without complications: Secondary | ICD-10-CM | POA: Diagnosis not present

## 2021-05-12 DIAGNOSIS — G4700 Insomnia, unspecified: Secondary | ICD-10-CM | POA: Diagnosis not present

## 2021-05-12 DIAGNOSIS — N289 Disorder of kidney and ureter, unspecified: Secondary | ICD-10-CM | POA: Diagnosis not present

## 2021-05-12 DIAGNOSIS — I10 Essential (primary) hypertension: Secondary | ICD-10-CM | POA: Diagnosis not present

## 2021-05-12 DIAGNOSIS — G4733 Obstructive sleep apnea (adult) (pediatric): Secondary | ICD-10-CM | POA: Diagnosis not present

## 2021-05-12 DIAGNOSIS — E039 Hypothyroidism, unspecified: Secondary | ICD-10-CM | POA: Diagnosis not present

## 2021-05-12 DIAGNOSIS — R600 Localized edema: Secondary | ICD-10-CM | POA: Diagnosis not present

## 2021-05-12 DIAGNOSIS — R0789 Other chest pain: Secondary | ICD-10-CM | POA: Diagnosis not present

## 2021-05-12 DIAGNOSIS — N1832 Chronic kidney disease, stage 3b: Secondary | ICD-10-CM | POA: Diagnosis not present

## 2021-05-12 DIAGNOSIS — R509 Fever, unspecified: Secondary | ICD-10-CM | POA: Diagnosis not present

## 2021-05-24 ENCOUNTER — Institutional Professional Consult (permissible substitution): Payer: Medicare HMO | Admitting: Thoracic Surgery (Cardiothoracic Vascular Surgery)

## 2021-05-24 ENCOUNTER — Encounter: Payer: Self-pay | Admitting: *Deleted

## 2021-05-24 ENCOUNTER — Encounter: Payer: Self-pay | Admitting: Thoracic Surgery (Cardiothoracic Vascular Surgery)

## 2021-05-24 ENCOUNTER — Other Ambulatory Visit: Payer: Self-pay

## 2021-05-24 DIAGNOSIS — R0789 Other chest pain: Secondary | ICD-10-CM | POA: Diagnosis not present

## 2021-05-24 NOTE — H&P (View-Only) (Signed)
PCP is Tracie Harrier, MD Referring Provider is Tracie Harrier, MD  Chief Complaint  Patient presents with   Chest Pain    New patient consultation with chest CT, sternal pain    HPI: Betty Peterson sent for consultation regarding pain at her xiphoid process.  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  She has been having problems with pain centered over the xiphoid process extending out on both sides dating back about a year.  She had a CT of the chest in September 2022 which showed a bifid xiphoid process with slight irregularity and some stranding of the soft tissue over the left side of it.  Of note there was no thymoma.  She saw thoracic surgeon at [facility].  She was having a lot of other issues at the time.  She now wishes to obtain a second opinion.  She continues to have pain in that area.  Obviously she has numerous medical problems but she says this is one of the things that is most impacting her quality of life currently.  She thinks it may be aggravating her stomach symptoms with early satiety.  Zubrod Score: At the time of surgery this patients most appropriate activity status/level should be described as: []     0    Normal activity, no symptoms []     1    Restricted in physical strenuous activity but ambulatory, able to do out light work [x]     2    Ambulatory and capable of self care, unable to do work activities, up and about >50 % of waking hours                              []     3    Only limited self care, in bed greater than 50% of waking hours []     4    Completely disabled, no self care, confined to bed or chair []     5    Moribund  Past Medical  History:  Diagnosis Date   Arthritis    Asthma    Fibromyalgia    Gastroparesis    Hypertension    Myasthenia gravis ([place])    Sleep apnea    Thyroid disease     Past Surgical History:  Procedure Laterality Date   ABDOMINAL HYSTERECTOMY     APPENDECTOMY     CHOLECYSTECTOMY     TONSILLECTOMY      Family History  Problem Relation Age of Onset   Diabetes Mother    Hypertension Mother    CAD Mother    Hypertension Father    CAD Father    CAD Brother    Breast cancer Maternal Grandmother     Social History Social History   Tobacco Use   Smoking status: Never  Substance Use Topics   Alcohol use: No   Drug use: No    Current Outpatient Medications  Medication Sig Dispense Refill   acetaminophen (TYLENOL) 500 MG tablet Take 500 mg by mouth every 4 (four) hours as needed for mild pain, moderate pain, fever or headache.     bisacodyl (DULCOLAX) 5 MG EC tablet Take 15 mg by mouth daily.     bisoprolol (ZEBETA) 10 MG tablet Take 10 mg by mouth daily.  bumetanide (BUMEX) 1 MG tablet Take 1 mg by mouth daily.     candesartan (ATACAND) 16 MG tablet Take 8 mg by mouth daily after supper.     chlorpheniramine (CHLOR-TRIMETON) 4 MG tablet Take 4 mg by mouth every 6 (six) hours as needed for allergies.     Cholecalciferol 1000 units capsule Take 1,000 Units by mouth daily.     Coenzyme Q10 (COQ10) 200 MG CAPS Take 200 mg by mouth daily.     diphenhydrAMINE (BENADRYL) 25 MG tablet Take 50 mg by mouth every 4 (four) hours as needed for itching, allergies or sleep.     fluticasone (FLONASE) 50 MCG/ACT nasal spray Place 2 sprays into both nostrils daily.     folic acid (FOLVITE) 1 MG tablet Take 1 mg by mouth daily.     iron polysaccharides (NIFEREX) 150 MG capsule Take 150 mg by mouth daily.     Lactobacillus (PROBIOTIC ACIDOPHILUS PO) Take 3 tablets by mouth daily.     levocetirizine (XYZAL) 5 MG tablet Take 5 mg by mouth every evening.     levothyroxine (SYNTHROID, LEVOTHROID)  150 MCG tablet Take 150 mcg by mouth daily before breakfast. Take on an empty stomach with a glass of water at least 30 minutes before breakfast.     losartan (COZAAR) 100 MG tablet Take 100 mg by mouth daily.     Magnesium 200 MG TABS Take 250 mg by mouth 3 (three) times daily.     Melatonin 10 MG CAPS Take 10 mg by mouth at bedtime.     metoCLOPramide (REGLAN) 10 MG tablet Take 10 mg by mouth 4 (four) times daily.     Multiple Vitamin (MULTIVITAMIN) tablet Take 1 tablet by mouth daily.     Omega-3 Fatty Acids (FISH OIL EXTRA STRENGTH) 1200 MG CAPS Take 1,200 mg by mouth 3 (three) times daily.     omeprazole (PRILOSEC) 40 MG capsule Take 40 mg by mouth daily.     ondansetron (ZOFRAN) 4 MG tablet Take 4 mg by mouth 2 (two) times daily as needed for nausea or vomiting.     POLYETHYLENE GLYCOL 3350 PO Take 17 g by mouth daily. *Mix in 4 - 8 ounces of fluid prior to taking*     potassium chloride (K-DUR) 10 MEQ tablet Take 10 mEq by mouth 2 (two) times daily.     pravastatin (PRAVACHOL) 20 MG tablet Take 20 mg by mouth at bedtime.     ranitidine (ZANTAC) 300 MG tablet Take 300 mg by mouth 2 (two) times daily.     tiZANidine (ZANAFLEX) 2 MG tablet Take 6 mg by mouth 3 (three) times daily.     traMADol (ULTRAM) 50 MG tablet Take 50 mg by mouth every 4 (four) hours as needed for moderate pain or severe pain.     vitamin B-12 (CYANOCOBALAMIN) 1000 MCG tablet Take 1,000 mcg by mouth daily.     No current facility-administered medications for this visit.    Allergies  Allergen Reactions   Betadine [Povidone Iodine] Anaphylaxis    Superficial use causes hives and anaphylaxis; Patient states she is "allergic to the stuff on the skin, but has been ok "when they use it in my veins".     Ambien [Zolpidem Tartrate] Other (See Comments)    hallucinations   Avelox [Moxifloxacin Hcl In Nacl] Hives   Barium-Containing Compounds Other (See Comments)    Patient states she forms barium stones.   Butorphanol  Tartrate Other (See Comments)  Carafate [Sucralfate] Hives   Morphine And Related Nausea And Vomiting   Prednisone Nausea And Vomiting   Simvastatin Other (See Comments)    Muscle pain   Tape Other (See Comments)    blisters   Tetracyclines & Related Nausea And Vomiting   Biaxin [Clarithromycin] Palpitations    Review of Systems  Constitutional:  Positive for activity change, appetite change, fever and unexpected weight change (Has lost 24 pounds in 3 months).  Eyes:  Positive for visual disturbance.  Respiratory:  Positive for cough, shortness of breath and wheezing.   Cardiovascular:  Positive for leg swelling.  Gastrointestinal:  Positive for abdominal pain and constipation.  Genitourinary:  Positive for difficulty urinating and frequency.  Musculoskeletal:  Positive for arthralgias, gait problem and myalgias.  Skin:        Itching  Neurological:  Positive for dizziness and headaches.  Hematological:  Bruises/bleeds easily.   BP 91/62 (BP Location: Left Arm, Patient Position: Sitting, Cuff Size: Normal)    Pulse (!) 110    Resp 20    Ht 5\' 5"  (1.651 m)    Wt 144 lb 6.4 oz (65.5 kg)    SpO2 99% Comment: RA   BMI 24.03 kg/m  Physical Exam Vitals reviewed.  Constitutional:      General: She is not in acute distress.    Appearance: She is ill-appearing.  HENT:     Head: Normocephalic and atraumatic.  Eyes:     General: No scleral icterus.    Extraocular Movements: Extraocular movements intact.  Neck:     Comments: Cervical collar Cardiovascular:     Rate and Rhythm: Normal rate and regular rhythm.     Pulses: Normal pulses.     Heart sounds: Normal heart sounds. No murmur heard. Pulmonary:     Effort: Pulmonary effort is normal. No respiratory distress.     Breath sounds: Normal breath sounds.  Musculoskeletal:     Comments: Prominent xiphoid process.  Tender to palpation.  No erythema or induration of skin.  Skin:    General: Skin is warm and dry.  Neurological:      General: No focal deficit present.     Mental Status: She is oriented to person, place, and time.     Motor: Weakness present.     Diagnostic Tests: I reviewed the CT images on the CD.  Agree with the finding of some stranding of soft tissue over the left portion of the xiphoid process.  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  She is followed by Dr. Patsey Berthold at [facility].  She has been on steroids previously but has been off of them since October.  She has a prominent xiphoid process with pain in that area.  This is distressing to her.  She says it is the biggest issue in terms of her quality of life currently.  She thinks it may be affecting her issues with appetite and early satiety.  I was quite adamant that it is not affecting her stomach and she should not expect any improvement in those symptoms.  She has had little relief with pain medications or lidocaine patches.  I offered her the option of xiphoid resection.  We can try to  do that under local with sedation.  There is a possibility she might require general anesthesia.  She is aware of that possibility.  We would need to have her evaluated by anesthesia preoperatively.  I described the proposed operation to her.  She understands the general nature of the procedure with the incision to be used, the use of local with intravenous sedation, the possibility of needing to convert to general anesthesia.  I informed her of the indications, risk, benefits, and alternatives.  This is not inherently a high risk procedure  but in her case even the most minor procedure carries significant risk.  She understands the risks include but not limited to infection, hernia, bleeding, as well as more general risks such as respiratory failure, MI, or DVT.  She accepts the risks and agrees to proceed.  Plan: Xiphoidectomy on Friday, 06/03/2021.  We will plan to do with local and intravenous sedation.   She understands possible need to convert to general anesthesia. She will need to see an anesthesiologist preoperatively We will plan for overnight observation.  [name], MD [facility] [phone]

## 2021-05-24 NOTE — Progress Notes (Signed)
PCP is Tracie Harrier, MD Referring Provider is Tracie Harrier, MD  Chief Complaint  Patient presents with   Chest Pain    New patient consultation with chest CT, sternal pain    HPI: Betty Peterson sent for consultation regarding pain at her xiphoid process.  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  She has been having problems with pain centered over the xiphoid process extending out on both sides dating back about a year.  She had a CT of the chest in September 2022 which showed a bifid xiphoid process with slight irregularity and some stranding of the soft tissue over the left side of it.  Of note there was no thymoma.  She saw thoracic surgeon at [facility].  She was having a lot of other issues at the time.  She now wishes to obtain a second opinion.  She continues to have pain in that area.  Obviously she has numerous medical problems but she says this is one of the things that is most impacting her quality of life currently.  She thinks it may be aggravating her stomach symptoms with early satiety.  Zubrod Score: At the time of surgery this patients most appropriate activity status/level should be described as: []     0    Normal activity, no symptoms []     1    Restricted in physical strenuous activity but ambulatory, able to do out light work [x]     2    Ambulatory and capable of self care, unable to do work activities, up and about >50 % of waking hours                              []     3    Only limited self care, in bed greater than 50% of waking hours []     4    Completely disabled, no self care, confined to bed or chair []     5    Moribund  Past Medical  History:  Diagnosis Date   Arthritis    Asthma    Fibromyalgia    Gastroparesis    Hypertension    Myasthenia gravis ([place])    Sleep apnea    Thyroid disease     Past Surgical History:  Procedure Laterality Date   ABDOMINAL HYSTERECTOMY     APPENDECTOMY     CHOLECYSTECTOMY     TONSILLECTOMY      Family History  Problem Relation Age of Onset   Diabetes Mother    Hypertension Mother    CAD Mother    Hypertension Father    CAD Father    CAD Brother    Breast cancer Maternal Grandmother     Social History Social History   Tobacco Use   Smoking status: Never  Substance Use Topics   Alcohol use: No   Drug use: No    Current Outpatient Medications  Medication Sig Dispense Refill   acetaminophen (TYLENOL) 500 MG tablet Take 500 mg by mouth every 4 (four) hours as needed for mild pain, moderate pain, fever or headache.     bisacodyl (DULCOLAX) 5 MG EC tablet Take 15 mg by mouth daily.     bisoprolol (ZEBETA) 10 MG tablet Take 10 mg by mouth daily.  bumetanide (BUMEX) 1 MG tablet Take 1 mg by mouth daily.     candesartan (ATACAND) 16 MG tablet Take 8 mg by mouth daily after supper.     chlorpheniramine (CHLOR-TRIMETON) 4 MG tablet Take 4 mg by mouth every 6 (six) hours as needed for allergies.     Cholecalciferol 1000 units capsule Take 1,000 Units by mouth daily.     Coenzyme Q10 (COQ10) 200 MG CAPS Take 200 mg by mouth daily.     diphenhydrAMINE (BENADRYL) 25 MG tablet Take 50 mg by mouth every 4 (four) hours as needed for itching, allergies or sleep.     fluticasone (FLONASE) 50 MCG/ACT nasal spray Place 2 sprays into both nostrils daily.     folic acid (FOLVITE) 1 MG tablet Take 1 mg by mouth daily.     iron polysaccharides (NIFEREX) 150 MG capsule Take 150 mg by mouth daily.     Lactobacillus (PROBIOTIC ACIDOPHILUS PO) Take 3 tablets by mouth daily.     levocetirizine (XYZAL) 5 MG tablet Take 5 mg by mouth every evening.     levothyroxine (SYNTHROID, LEVOTHROID)  150 MCG tablet Take 150 mcg by mouth daily before breakfast. Take on an empty stomach with a glass of water at least 30 minutes before breakfast.     losartan (COZAAR) 100 MG tablet Take 100 mg by mouth daily.     Magnesium 200 MG TABS Take 250 mg by mouth 3 (three) times daily.     Melatonin 10 MG CAPS Take 10 mg by mouth at bedtime.     metoCLOPramide (REGLAN) 10 MG tablet Take 10 mg by mouth 4 (four) times daily.     Multiple Vitamin (MULTIVITAMIN) tablet Take 1 tablet by mouth daily.     Omega-3 Fatty Acids (FISH OIL EXTRA STRENGTH) 1200 MG CAPS Take 1,200 mg by mouth 3 (three) times daily.     omeprazole (PRILOSEC) 40 MG capsule Take 40 mg by mouth daily.     ondansetron (ZOFRAN) 4 MG tablet Take 4 mg by mouth 2 (two) times daily as needed for nausea or vomiting.     POLYETHYLENE GLYCOL 3350 PO Take 17 g by mouth daily. *Mix in 4 - 8 ounces of fluid prior to taking*     potassium chloride (K-DUR) 10 MEQ tablet Take 10 mEq by mouth 2 (two) times daily.     pravastatin (PRAVACHOL) 20 MG tablet Take 20 mg by mouth at bedtime.     ranitidine (ZANTAC) 300 MG tablet Take 300 mg by mouth 2 (two) times daily.     tiZANidine (ZANAFLEX) 2 MG tablet Take 6 mg by mouth 3 (three) times daily.     traMADol (ULTRAM) 50 MG tablet Take 50 mg by mouth every 4 (four) hours as needed for moderate pain or severe pain.     vitamin B-12 (CYANOCOBALAMIN) 1000 MCG tablet Take 1,000 mcg by mouth daily.     No current facility-administered medications for this visit.    Allergies  Allergen Reactions   Betadine [Povidone Iodine] Anaphylaxis    Superficial use causes hives and anaphylaxis; Patient states she is "allergic to the stuff on the skin, but has been ok "when they use it in my veins".     Ambien [Zolpidem Tartrate] Other (See Comments)    hallucinations   Avelox [Moxifloxacin Hcl In Nacl] Hives   Barium-Containing Compounds Other (See Comments)    Patient states she forms barium stones.   Butorphanol  Tartrate Other (See Comments)  Carafate [Sucralfate] Hives   Morphine And Related Nausea And Vomiting   Prednisone Nausea And Vomiting   Simvastatin Other (See Comments)    Muscle pain   Tape Other (See Comments)    blisters   Tetracyclines & Related Nausea And Vomiting   Biaxin [Clarithromycin] Palpitations    Review of Systems  Constitutional:  Positive for activity change, appetite change, fever and unexpected weight change (Has lost 24 pounds in 3 months).  Eyes:  Positive for visual disturbance.  Respiratory:  Positive for cough, shortness of breath and wheezing.   Cardiovascular:  Positive for leg swelling.  Gastrointestinal:  Positive for abdominal pain and constipation.  Genitourinary:  Positive for difficulty urinating and frequency.  Musculoskeletal:  Positive for arthralgias, gait problem and myalgias.  Skin:        Itching  Neurological:  Positive for dizziness and headaches.  Hematological:  Bruises/bleeds easily.   BP 91/62 (BP Location: Left Arm, Patient Position: Sitting, Cuff Size: Normal)    Pulse (!) 110    Resp 20    Ht 5\' 5"  (1.651 m)    Wt 144 lb 6.4 oz (65.5 kg)    SpO2 99% Comment: RA   BMI 24.03 kg/m  Physical Exam Vitals reviewed.  Constitutional:      General: She is not in acute distress.    Appearance: She is ill-appearing.  HENT:     Head: Normocephalic and atraumatic.  Eyes:     General: No scleral icterus.    Extraocular Movements: Extraocular movements intact.  Neck:     Comments: Cervical collar Cardiovascular:     Rate and Rhythm: Normal rate and regular rhythm.     Pulses: Normal pulses.     Heart sounds: Normal heart sounds. No murmur heard. Pulmonary:     Effort: Pulmonary effort is normal. No respiratory distress.     Breath sounds: Normal breath sounds.  Musculoskeletal:     Comments: Prominent xiphoid process.  Tender to palpation.  No erythema or induration of skin.  Skin:    General: Skin is warm and dry.  Neurological:      General: No focal deficit present.     Mental Status: She is oriented to person, place, and time.     Motor: Weakness present.     Diagnostic Tests: I reviewed the CT images on the CD.  Agree with the finding of some stranding of soft tissue over the left portion of the xiphoid process.  Impression: Taelor Waymire is a 61 year old woman with numerous medical problems including myasthenia gravis, possible mitochondrial disease, arthritis, anemia, arrhythmia, asthma, chronic bronchitis, COPD, diabetes, reflux, gastroparesis, fibromyalgia, hypertension, hyperparathyroidism, hypothyroidism, migraines, myalgias, osteoarthritis, osteoporosis, and sleep apnea.  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  She is followed by Dr. Patsey Berthold at [facility].  She has been on steroids previously but has been off of them since October.  She has a prominent xiphoid process with pain in that area.  This is distressing to her.  She says it is the biggest issue in terms of her quality of life currently.  She thinks it may be affecting her issues with appetite and early satiety.  I was quite adamant that it is not affecting her stomach and she should not expect any improvement in those symptoms.  She has had little relief with pain medications or lidocaine patches.  I offered her the option of xiphoid resection.  We can try to  do that under local with sedation.  There is a possibility she might require general anesthesia.  She is aware of that possibility.  We would need to have her evaluated by anesthesia preoperatively.  I described the proposed operation to her.  She understands the general nature of the procedure with the incision to be used, the use of local with intravenous sedation, the possibility of needing to convert to general anesthesia.  I informed her of the indications, risk, benefits, and alternatives.  This is not inherently a high risk procedure  but in her case even the most minor procedure carries significant risk.  She understands the risks include but not limited to infection, hernia, bleeding, as well as more general risks such as respiratory failure, MI, or DVT.  She accepts the risks and agrees to proceed.  Plan: Xiphoidectomy on Friday, 06/03/2021.  We will plan to do with local and intravenous sedation.   She understands possible need to convert to general anesthesia. She will need to see an anesthesiologist preoperatively We will plan for overnight observation.  [name], MD [facility] [phone]

## 2021-05-25 ENCOUNTER — Other Ambulatory Visit: Payer: Self-pay | Admitting: *Deleted

## 2021-05-25 DIAGNOSIS — R0789 Other chest pain: Secondary | ICD-10-CM

## 2021-05-27 NOTE — Progress Notes (Addendum)
Anesthesia Evaluation:  Case: [id number] Date/Time: 06/03/21 0715   Procedure: Removal of Xyphoid Process   Anesthesia type: Monitor Anesthesia Care   Pre-op diagnosis: XIPHOID PAIN   Location: [facility] OR ROOM 10 / [facility]   Surgeons: [name], MD       DISCUSSION: Patient is a 61 year old female scheduled for the above procedure. He is also planning to take a muscle biopsy.   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 Ascending thoracic aorta measured 4.0 cm on 12/29/20 CT ([facility]). Surgeries include appendectomy, hysterectomy, tonsillectomy, cholecystectomy. She reported issues with hypotension after sedation for CVL in 04/2020.  Per 05/05/21 [facility] neurology note by Dr. Patsey Berthold,  she was initially evaluated there on 02/02/20, "At that time, she reported constant diplopia, vertical and horizontal that was worse at night, heaviness of her head, difficulty getting up from a toilet, shortness of breath with exertion such as a flight of stairs, and weakness with doing her hair or brushing her teeth. She was initially evaluated by an ophthalmologist who suspected myasthenia gravis. ACHR and MUSK ab resulted negative in March 2020. [facility] 01/2020 showed evidence of increased jitter and blocking, consistent with myasthenia gravis." She was started on pyridostigmine 90 mg TID ~ August 2021 with improvement in weakness but not diplopia. Azathioprine was added ~ 01/2020 but was discontinued due to hypotension. She was admitted 04/2020 with suspected MG exacerbation. 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 She was later weaned from prednisone due to no clear benefit in  her symptoms. Given her lack of response to treatment and possible early symptoms in childhood, she underwent a "[employer] congenital myasthenic syndrome panel was checked and was negative. In addition, an MRI of the orbits was obtained which demonstrated symmetric atrophic appearance of the extraocular musculature bilaterally. An EMG in March 2022 demonstrated no evidence of a myopathy. 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 There are Wolfram-like syndromes that can occur in an autosomal dominant inheritance pattern." Given mitochondrial testing results, Dr. Patsey Berthold added, "I am feeling less confident about her diagnosis of myasthenia gravis (or at least that it is a major contributor to her symptoms). 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 Using oxycodone for pain. Also having hiccups that last up to 45 minutes and poor appetite with 62 lb weight loss over the past year (weighed ~ 210 lb 04/2020). She had reported intermittent fevers for about a month. He wanted her to see rheumatology prior to considering xiphoid surgery. She requested a second opinion and was referred to Dr. Roxan Hockey. Since then, she did get referred to rheumatology, but appointments are not until April. She had blood and urine cultures on 05/12/21 ([facility])--blood culture showed no growth x5 days but blood volume collected was sub-optimal, so consider recollection to improve recovery. Her urine culture showed < 10K colonies, thought to be clinically insignificant. She reported that she  continues  to have intermittent fevers for about three months now. Says fever can get as high was 101 range with associated chills, but resolved within ~ 60-90 minutes.  No obvious sick symptoms. She thinks it is related to her "inflamed" xyphoid. No new cough or rash. She did have diarrhea, but said it resolved recently after she stopped Cellcept and Mestinon. WBC 14.5K, communicated to [facility] RN. Dr. Roxan Hockey is aware of fever history.  She was evaluated by Dr. Roxan Hockey on 05/24/21. He notes that her painful, prominent xiphoid process is distressing to her and the "biggest issue in terms of her quality of life currently:"  She thinks it may be causing her issues with appetite and early satiety. She had little relief with pain medication and lidocaine patches. He offered xiphoid resection with plan to do with local anesthesia and IV sedation, but understanding she may have to be converted to general anesthesia. Overnight observation is anticipated. She says her husband is planning to stay with her.   Dr. Roxan Hockey requested she be evaluated by an anesthesiologist during her PAT visit. Patient evaluated by Criss Rosales, DO at 06/01/21 PAT visit. She reported that her Cellcept and Mestinon were recently stopped because she was having GI side effects, and it was unclear that she was getting much benefit from them. She has also been off steroids for several months now.  Dr. Roxan Hockey classified her Zubrod Score as 2: Ambulatory and capable of self care, unable to do work activities, up and about >50 % of waking hours.  Preoperative COVID-19 test negative. 06/01/21 CXR report is in process. Anesthesia team to evaluate on the day of surgery.    VS: BP 98/66    Pulse 91    Temp 36.7 C (Oral)    Resp 18    Ht 5\' 5"  (1.651 m)    Wt 64.8 kg    SpO2 99%    BMI 23.76 kg/m  Patient and providers wearing face masks. She was in hospital wheelchair. She wears a soft collar when sitting upright because she says  her neck will tend to fall forward. She has chronic weakness in her extremities, but is able to walk in her home using wall support. If she is out of the house, she will use a scooter or wheelchair. She does not use home oxygen. Last Myasthenia exacerbation was 1/20222.    PROVIDERS: Tracie Harrier, MD is PCP [facility], see [facility] Care Everywhere) - Darryl Nestle, MD is neurologist ([facility] in [place]). Jennings Books, MD is neurologist [facility]) - Cloretta Ned, MD is cardiologist ([facility]). Last visit 10/29/20 for atypical chest pain, which had resolved. Palpitations correlated with PVCs and PACs which have been infrequent. 1 year follow-up planned. Clinton Gallant, MD is nephrologist ([facility]). Last visit 09/07/20 for follow-up CKD stage 3a, likely HTN and age-related. Trula Ore, MD is geneticist ([facility]) - [place], Linwood Dibbles, MD is ophthalmologist ([facility]) - She has pending rheumatology evaluations. She had an initial consult visit with Ephriam Jenkins, MD at [facility] for 07/19/21 and with Romero Liner, MD on 01/05/22 at [place].    LABS: Labs reviewed: Acceptable for surgery. (all labs ordered are listed, but only abnormal results are displayed)  Labs Reviewed  APTT - Abnormal; Notable for the following components:      Result Value   aPTT 21 (*)    All other components within normal limits  BLOOD GAS, ARTERIAL - Abnormal; Notable for the following components:  Bicarbonate 18.2 (*)    Acid-base deficit 6.4 (*)    All other components within normal limits  CBC - Abnormal; Notable for the following components:   WBC 14.5 (*)    Hemoglobin 11.6 (*)    All other components within normal limits  COMPREHENSIVE METABOLIC PANEL - Abnormal; Notable for the following components:   Sodium 132 (*)    CO2 16 (*)    Glucose, Bld 116 (*)    Creatinine, Ser 1.31 (*)    Total Protein 6.0 (*)    AST 13 (*)    GFR,  Estimated 46 (*)    All other components within normal limits  URINALYSIS, ROUTINE W REFLEX MICROSCOPIC - Abnormal; Notable for the following components:   Leukocytes,Ua TRACE (*)    Bacteria, UA RARE (*)    All other components within normal limits  GLUCOSE, CAPILLARY - Abnormal; Notable for the following components:   Glucose-Capillary 121 (*)    All other components within normal limits  SURGICAL PCR SCREEN  SARS CORONAVIRUS 2 (TAT 6-24 HRS)  PROTIME-INR  HEMOGLOBIN A1C  TYPE AND SCREEN   Comparison labs 05/05/21 ([facility]): H/H 11.5/37.8, PLT 305, NA 136, K 3.9, BUN 15, Cr 1.5, glucose 99, Ca 10, AST 17, ALT 13, total bili 0.1, albumin 3.8, total protein 6.0. A1c 5.6 and TSH 0.142 (L) 02/18/21.    She had a left deltoid skeletal muscle biopsy on 03/25/21 ([facility]).   IMAGES: CXR 06/01/21: In process.  CT Chest 12/29/20 ([facility]): IMPRESSION:  - No CT evidence of thymoma.  - Prominent xiphoid processes particularly on the left with slight  irregularity of the inferior aspect of the left inferior xiphoid process  and slight soft tissue stranding. This could be related to trauma or  inflammation (Teitze syndrome). Correlate clinically for site of palpable  abnormality.   - Mild ascending aortic dilation to 4.0 cm.  - No aortic aneurysm by 12/23/20 CT abd/pelvis ([facility])   MRI Orbits 06/25/20 ([facility]): IMPRESSION:  1. Symmetric atrophic appearance of the extraocular musculature  bilaterally, finding which can be seen in the setting of chronic myasthenia  gravis.  2. No acute intracranial abnormalities. No territorial infarct,  intracranial hemorrhage or mass effect.     EKG: 05/05/21 ([facility]): Per Narrative in Care Everywhere: Normal sinus rhythm  Low voltage in precordial leads  Nonspecific T wave abnormalities, diffuse leads  Artifact Present  Abnormal ECG  When compared with ECG of 18-May-2020 12:56,  Nonspecific T wave abnormalities, diffuse leads are now present  I  reviewed and concur with this report. Electronically signed NU:UVOZD, MD, RAVI ([id number]) on 05/05/2021 3:39:00 PM - Copy of tracing on shadow chart.   CV: Echo 08/20/20 ([facility]): NORMAL LEFT VENTRICULAR SYSTOLIC FUNCTION WITH MILD LVH. Estimated EF > 55%. Calculated EF 58%.  NORMAL LA PRESSURES WITH NORMAL DIASTOLIC FUNCTION  NORMAL RIGHT VENTRICULAR SYSTOLIC FUNCTION  VALVULAR REGURGITATION: TRIVIAL MR, TRIVIAL PR, TRIVIAL TR  NO VALVULAR STENOSIS    Holter Monitor 05/19/20-05/20/20 ([facility]): Conclusions:  1) This 48 hour Holter scan was adequated for interpretation.  2) The patient was in sinus rhythm, sinus tachycardia.    3) Ventricular ectopic activity consisted of multifocal PVCs (strips 9, 11, 12), couplets (strips 8, 16), an isolated  triplet (strip 18).  4) Supraventricular ectopic activity consisted of PACs (strips 5, 13), atrial pairs (7, 15), bi/trigeminy (strips 17, 20), SVT. The longest and fastest SVT was 3 beats, 211 BPM at 1:39 PM (  strip 19).  5) There were no pauses greater  than 2.0 seconds noted.  6) There were no symptoms noted in patient diary, nor was the event button activated.    Per [facility] notes, "Cardiac catheterization 10/27/2009 showed normal coronary arteries, borderline/mildly elevated filling pressures."   Past Medical History:  Diagnosis Date   Arthritis    Asthma    Complication of anesthesia    History of myasthenia gravis and mitochrondrial variant followed by [facility]; reported hypotension after sedation for CVL 04/2020   Fibromyalgia    Gastroparesis    Hypertension    Myasthenia gravis ([place])    Sleep apnea    Thyroid disease     Past Surgical History:  Procedure Laterality Date   ABDOMINAL HYSTERECTOMY     APPENDECTOMY     CHOLECYSTECTOMY     TONSILLECTOMY      MEDICATIONS:  acetaminophen (TYLENOL) 500 MG tablet   acidophilus (RISAQUAD) CAPS capsule   albuterol (VENTOLIN HFA) 108 (90 Base) MCG/ACT inhaler   Ascorbic Acid  (VITAMIN C) 1000 MG tablet   azelastine (ASTELIN) 0.1 % nasal spray   bisacodyl (DULCOLAX) 5 MG EC tablet   candesartan (ATACAND) 4 MG tablet   Cholecalciferol 50 MCG (2000 UT) TABS   diclofenac Sodium (VOLTAREN) 1 % GEL   diphenhydrAMINE (BENADRYL) 25 MG tablet   famotidine (PEPCID) 20 MG tablet   fluticasone (FLONASE) 50 MCG/ACT nasal spray   hydrocortisone 2.5 % cream   hydrOXYzine (VISTARIL) 50 MG capsule   iron polysaccharides (NIFEREX) 150 MG capsule   levocetirizine (XYZAL) 5 MG tablet   levothyroxine (SYNTHROID, LEVOTHROID) 150 MCG tablet   Magnesium 400 MG TABS   MELATONIN PO   metFORMIN (GLUCOPHAGE) 500 MG tablet   metoCLOPramide (REGLAN) 10 MG tablet   montelukast (SINGULAIR) 10 MG tablet   naratriptan (AMERGE) 2.5 MG tablet   nortriptyline (PAMELOR) 50 MG capsule   ondansetron (ZOFRAN) 4 MG tablet   oxyCODONE-acetaminophen (PERCOCET/ROXICET) 5-325 MG tablet   pantoprazole (PROTONIX) 40 MG tablet   polyethylene glycol powder (GLYCOLAX/MIRALAX) 17 GM/SCOOP powder   Potassium Chloride ER 20 MEQ TBCR   pravastatin (PRAVACHOL) 20 MG tablet   promethazine (PHENERGAN) 25 MG tablet   Rimegepant Sulfate (NURTEC) 75 MG TBDP   rOPINIRole (REQUIP) 0.25 MG tablet   rOPINIRole (REQUIP) 0.5 MG tablet   topiramate (TOPAMAX) 100 MG tablet   torsemide (DEMADEX) 20 MG tablet   traMADol (ULTRAM) 50 MG tablet   vitamin B-12 (CYANOCOBALAMIN) 1000 MCG tablet   zinc gluconate 50 MG tablet   No current facility-administered medications for this encounter.    Myra Gianotti, PA-C Surgical Short Stay/Anesthesiology [facility] Phone [phone] [facility] Phone [phone] 06/02/2021 10:17 AM

## 2021-05-31 NOTE — Progress Notes (Signed)
Surgical Instructions    Your procedure is scheduled on 06/03/21.  Report to [facility] Main Entrance "A" at 5:30 A.M., then check in with the Admitting office.  Call this number if you have problems the morning of surgery:  [phone]   If you have any questions prior to your surgery date call [phone]: Open Monday-Friday 8am-4pm    Remember:  Do not eat or drink after midnight the night before your surgery      Take these medicines the morning of surgery with A SIP OF WATER:  azelastine (ASTELIN)  famotidine (PEPCID) hydrOXYzine (VISTARIL)  levothyroxine (SYNTHROID, LEVOTHROID) metoCLOPramide (REGLAN)  mycophenolate (CELLCEPT)  naratriptan (AMERGE) ondansetron (ZOFRAN) oxyCODONE-acetaminophen (PERCOCET/ROXICET)  pantoprazole (PROTONIX)  promethazine (PHENERGAN) pyridostigmine (MESTINON) Rimegepant Sulfate (NURTEC)  rOPINIRole (REQUIP) topiramate (TOPAMAX) traMADol (ULTRAM)  IF NEEDED: acetaminophen (TYLENOL) albuterol (VENTOLIN HFA)  diphenhydrAMINE (BENADRYL)   As of today, STOP taking any Aspirin (unless otherwise instructed by your surgeon) Aleve, Naproxen, Ibuprofen, Motrin, Advil, Goody's, BC's, all herbal medications, fish oil, diclofenac Sodium (VOLTAREN) and all vitamins.  WHAT DO I DO ABOUT MY DIABETES MEDICATION?   Do not take oral diabetes medicines (pills) the morning of surgery.      THE MORNING OF SURGERY, do not take metFORMIN (GLUCOPHAGE).  The day of surgery, do not take other diabetes injectables, including Byetta (exenatide), Bydureon (exenatide ER), Victoza (liraglutide), or Trulicity (dulaglutide).  If your CBG is greater than 220 mg/dL, you may take  of your sliding scale (correction) dose of insulin.   HOW TO MANAGE YOUR DIABETES BEFORE AND AFTER SURGERY  Why is it important to control my blood sugar before and after surgery? Improving blood sugar levels before and after surgery helps healing and can limit problems. A way of  improving blood sugar control is eating a healthy diet by:  Eating less sugar and carbohydrates  Increasing activity/exercise  Talking with your doctor about reaching your blood sugar goals High blood sugars (greater than 180 mg/dL) can raise your risk of infections and slow your recovery, so you will need to focus on controlling your diabetes during the weeks before surgery. Make sure that the doctor who takes care of your diabetes knows about your planned surgery including the date and location.  How do I manage my blood sugar before surgery? Check your blood sugar at least 4 times a day, starting 2 days before surgery, to make sure that the level is not too high or low.  Check your blood sugar the morning of your surgery when you wake up and every 2 hours until you get to the Short Stay unit.  If your blood sugar is less than 70 mg/dL, you will need to treat for low blood sugar: Do not take insulin. Treat a low blood sugar (less than 70 mg/dL) with  cup of clear juice (cranberry or apple), 4 glucose tablets, OR glucose gel. Recheck blood sugar in 15 minutes after treatment (to make sure it is greater than 70 mg/dL). If your blood sugar is not greater than 70 mg/dL on recheck, call [phone] for further instructions. Report your blood sugar to the short stay nurse when you get to Short Stay.  If you are admitted to the hospital after surgery: Your blood sugar will be checked by the staff and you will probably be given insulin after surgery (instead of oral diabetes medicines) to make sure you have good blood sugar levels. The goal for blood sugar control after surgery is 80-180 mg/dL.  Do not wear jewelry or makeup Do not wear lotions, powders, perfumes, or deodorant. Do not shave 48 hours prior to surgery.   Do not bring valuables to the hospital. Do not wear nail polish, gel polish, artificial nails, or any other type of covering on natural nails (fingers and toes) If you  have artificial nails or gel coating that need to be removed by a nail salon, please have this removed prior to surgery. Artificial nails or gel coating may interfere with anesthesia's ability to adequately monitor your vital signs.  [facility] is not responsible for any belongings or valuables. .   Do NOT Smoke (Tobacco/Vaping)  24 hours prior to your procedure  If you use a CPAP at night, you may bring your mask for your overnight stay.   Contacts, glasses, hearing aids, dentures or partials may not be worn into surgery, please bring cases for these belongings   For patients admitted to the hospital, discharge time will be determined by your treatment team.   Patients discharged the day of surgery will not be allowed to drive home, and someone needs to stay with them for 24 hours.  NO VISITORS WILL BE ALLOWED IN PRE-OP WHERE PATIENTS ARE PREPPED FOR SURGERY.  ONLY 1 SUPPORT PERSON MAY BE PRESENT IN THE WAITING ROOM WHILE YOU ARE IN SURGERY.  IF YOU ARE TO BE ADMITTED, ONCE YOU ARE IN YOUR ROOM YOU WILL BE ALLOWED TWO (2) VISITORS. 1 (ONE) VISITOR MAY STAY OVERNIGHT BUT MUST ARRIVE TO THE ROOM BY 8pm.  Minor children may have two parents present. Special consideration for safety and communication needs will be reviewed on a case by case basis.  Special instructions:    Oral Hygiene is also important to reduce your risk of infection.  Remember - BRUSH YOUR TEETH THE MORNING OF SURGERY WITH YOUR REGULAR TOOTHPASTE   [place] [facility]- Preparing For Surgery  Before surgery, you can play an important role. Because skin is not sterile, your skin needs to be as free of germs as possible. You can reduce the number of germs on your skin by washing with CHG (chlorahexidine gluconate) Soap before surgery.  CHG is an antiseptic cleaner which kills germs and bonds with the skin to continue killing germs even after washing.     Please do not use if you have an allergy to CHG or antibacterial soaps. If your  skin becomes reddened/irritated stop using the CHG.  Do not shave (including legs and underarms) for at least 48 hours prior to first CHG shower. It is OK to shave your face.  Please follow these instructions carefully.     Shower the NIGHT BEFORE SURGERY and the MORNING OF SURGERY with CHG Soap.   If you chose to wash your hair, wash your hair first as usual with your normal shampoo. After you shampoo, rinse your hair and body thoroughly to remove the shampoo.  Then [employer] and genitals (private parts) with your normal soap and rinse thoroughly to remove soap.  After that Use CHG Soap as you would any other liquid soap. You can apply CHG directly to the skin and wash gently with a scrungie or a clean washcloth.   Apply the CHG Soap to your body ONLY FROM THE NECK DOWN.  Do not use on open wounds or open sores. Avoid contact with your eyes, ears, mouth and genitals (private parts). Wash Face and genitals (private parts)  with your normal soap.   Wash thoroughly, paying special attention  to the area where your surgery will be performed.  Thoroughly rinse your body with warm water from the neck down.  DO NOT shower/wash with your normal soap after using and rinsing off the CHG Soap.  Pat yourself dry with a CLEAN TOWEL.  Wear CLEAN PAJAMAS to bed the night before surgery  Place CLEAN SHEETS on your bed the night before your surgery  DO NOT SLEEP WITH PETS.   Day of Surgery: Take a shower with CHG soap. Wear Clean/Comfortable clothing the morning of surgery Do not apply any deodorants/lotions.   Remember to brush your teeth WITH YOUR REGULAR TOOTHPASTE.    COVID testing  If you are going to stay overnight or be admitted after your procedure/surgery and require a pre-op COVID test, please follow these instructions after your COVID test   You are not required to quarantine however you are required to wear a well-fitting mask when you are out and around people not in your  household.  If your mask becomes wet or soiled, replace with a new one.  Wash your hands often with soap and water for 20 seconds or clean your hands with an alcohol-based hand sanitizer that contains at least 60% alcohol.  Do not share personal items.  Notify your provider: if you are in close contact with someone who has COVID  or if you develop a fever of 100.4 or greater, sneezing, cough, sore throat, shortness of breath or body aches.    Please read over the following fact sheets that you were given.

## 2021-05-31 NOTE — Pre-Procedure Instructions (Incomplete)
Surgical Instructions    Your procedure is scheduled on 06/03/21.  Report to [facility] Main Entrance "A" at 05:30 A.M., then check in with the Admitting office.  Call this number if you have problems the morning of surgery:  [phone]   If you have any questions prior to your surgery date call [phone]: Open Monday-Friday 8am-4pm    Remember:  Do not eat or drink after midnight the night before your surgery    Take these medicines the morning of surgery with A SIP OF WATER:  famotidine (PEPCID) fluticasone (FLONASE azelastine (ASTELIN) hydrOXYzine (VISTARIL) levothyroxine (SYNTHROID, LEVOTHROID) metoCLOPramide (REGLAN montelukast (SINGULAIR   As of today, STOP taking any Aspirin (unless otherwise instructed by your surgeon) Aleve, Naproxen, Ibuprofen, Motrin, Advil, Goody's, BC's, all herbal medications, fish oil, and all vitamins.           Do not wear jewelry or makeup Do not wear lotions, powders, perfumes/colognes, or deodorant. Do not shave 48 hours prior to surgery.  Men may shave face and neck. Do not bring valuables to the hospital. Do not wear nail polish, gel polish, artificial nails, or any other type of covering on natural nails (fingers and toes) If you have artificial nails or gel coating that need to be removed by a nail salon, please have this removed prior to surgery. Artificial nails or gel coating may interfere with anesthesia's ability to adequately monitor your vital signs.  [facility] is not responsible for any belongings or valuables. .   Do NOT Smoke (Tobacco/Vaping)  24 hours prior to your procedure  If you use a CPAP at night, you may bring your mask for your overnight stay.   Contacts, glasses, hearing aids, dentures or partials may not be worn into surgery, please bring cases for these belongings   For patients admitted to the hospital, discharge time will be determined by your treatment team.   Patients discharged the day of surgery  will not be allowed to drive home, and someone needs to stay with them for 24 hours.  NO VISITORS WILL BE ALLOWED IN PRE-OP WHERE PATIENTS ARE PREPPED FOR SURGERY.  ONLY 1 SUPPORT PERSON MAY BE PRESENT IN THE WAITING ROOM WHILE YOU ARE IN SURGERY.  IF YOU ARE TO BE ADMITTED, ONCE YOU ARE IN YOUR ROOM YOU WILL BE ALLOWED TWO (2) VISITORS. 1 (ONE) VISITOR MAY STAY OVERNIGHT BUT MUST ARRIVE TO THE ROOM BY 8pm.  Minor children may have two parents present. Special consideration for safety and communication needs will be reviewed on a case by case basis.  Special instructions:    Oral Hygiene is also important to reduce your risk of infection.  Remember - BRUSH YOUR TEETH THE MORNING OF SURGERY WITH YOUR REGULAR TOOTHPASTE   [facility]- Preparing For Surgery  Before surgery, you can play an important role. Because skin is not sterile, your skin needs to be as free of germs as possible. You can reduce the number of germs on your skin by washing with CHG (chlorahexidine gluconate) Soap before surgery.  CHG is an antiseptic cleaner which kills germs and bonds with the skin to continue killing germs even after washing.     Please do not use if you have an allergy to CHG or antibacterial soaps. If your skin becomes reddened/irritated stop using the CHG.  Do not shave (including legs and underarms) for at least 48 hours prior to first CHG shower. It is OK to shave your face.  Please follow these instructions  carefully.     Shower the NIGHT BEFORE SURGERY and the MORNING OF SURGERY with CHG Soap.   If you chose to wash your hair, wash your hair first as usual with your normal shampoo. After you shampoo, rinse your hair and body thoroughly to remove the shampoo.  Then [employer] and genitals (private parts) with your normal soap and rinse thoroughly to remove soap.  After that Use CHG Soap as you would any other liquid soap. You can apply CHG directly to the skin and wash gently with a scrungie or a clean  washcloth.   Apply the CHG Soap to your body ONLY FROM THE NECK DOWN.  Do not use on open wounds or open sores. Avoid contact with your eyes, ears, mouth and genitals (private parts). Wash Face and genitals (private parts)  with your normal soap.   Wash thoroughly, paying special attention to the area where your surgery will be performed.  Thoroughly rinse your body with warm water from the neck down.  DO NOT shower/wash with your normal soap after using and rinsing off the CHG Soap.  Pat yourself dry with a CLEAN TOWEL.  Wear CLEAN PAJAMAS to bed the night before surgery  Place CLEAN SHEETS on your bed the night before your surgery  DO NOT SLEEP WITH PETS.   Day of Surgery:  Take a shower with CHG soap. Wear Clean/Comfortable clothing the morning of surgery Do not apply any deodorants/lotions.   Remember to brush your teeth WITH YOUR REGULAR TOOTHPASTE.    COVID testing  If you are going to stay overnight or be admitted after your procedure/surgery and require a pre-op COVID test, please follow these instructions after your COVID test   You are not required to quarantine however you are required to wear a well-fitting mask when you are out and around people not in your household.  If your mask becomes wet or soiled, replace with a new one.  Wash your hands often with soap and water for 20 seconds or clean your hands with an alcohol-based hand sanitizer that contains at least 60% alcohol.  Do not share personal items.  Notify your provider: if you are in close contact with someone who has COVID  or if you develop a fever of 100.4 or greater, sneezing, cough, sore throat, shortness of breath or body aches.    Please read over the following fact sheets that you were given.

## 2021-06-01 ENCOUNTER — Ambulatory Visit (HOSPITAL_COMMUNITY)
Admission: RE | Admit: 2021-06-01 | Discharge: 2021-06-01 | Disposition: A | Discharge status: Home / Self Care | Payer: Medicare HMO | Source: Ambulatory Visit | Attending: Thoracic Surgery (Cardiothoracic Vascular Surgery) | Admitting: Thoracic Surgery (Cardiothoracic Vascular Surgery)

## 2021-06-01 ENCOUNTER — Encounter (HOSPITAL_COMMUNITY): Payer: Self-pay

## 2021-06-01 ENCOUNTER — Other Ambulatory Visit: Payer: Self-pay

## 2021-06-01 ENCOUNTER — Encounter (HOSPITAL_COMMUNITY)
Admission: RE | Admit: 2021-06-01 | Discharge: 2021-06-01 | Disposition: A | Discharge status: Home / Self Care | Payer: Medicare HMO | Source: Ambulatory Visit | Attending: Thoracic Surgery (Cardiothoracic Vascular Surgery) | Admitting: Thoracic Surgery (Cardiothoracic Vascular Surgery)

## 2021-06-01 VITALS — BP 98/66 | HR 91 | Temp 98.1°F | Resp 18 | Ht 65.0 in | Wt 142.8 lb

## 2021-06-01 DIAGNOSIS — Z01818 Encounter for other preprocedural examination: Secondary | ICD-10-CM

## 2021-06-01 DIAGNOSIS — Z881 Allergy status to other antibiotic agents status: Secondary | ICD-10-CM | POA: Diagnosis not present

## 2021-06-01 DIAGNOSIS — Z885 Allergy status to narcotic agent status: Secondary | ICD-10-CM | POA: Diagnosis not present

## 2021-06-01 DIAGNOSIS — Z8249 Family history of ischemic heart disease and other diseases of the circulatory system: Secondary | ICD-10-CM | POA: Diagnosis not present

## 2021-06-01 DIAGNOSIS — D704 Cyclic neutropenia: Secondary | ICD-10-CM | POA: Diagnosis not present

## 2021-06-01 DIAGNOSIS — M899 Disorder of bone, unspecified: Secondary | ICD-10-CM | POA: Diagnosis not present

## 2021-06-01 DIAGNOSIS — G7000 Myasthenia gravis without (acute) exacerbation: Secondary | ICD-10-CM | POA: Insufficient documentation

## 2021-06-01 DIAGNOSIS — M810 Age-related osteoporosis without current pathological fracture: Secondary | ICD-10-CM | POA: Diagnosis present

## 2021-06-01 DIAGNOSIS — G4733 Obstructive sleep apnea (adult) (pediatric): Secondary | ICD-10-CM | POA: Diagnosis present

## 2021-06-01 DIAGNOSIS — Z9049 Acquired absence of other specified parts of digestive tract: Secondary | ICD-10-CM | POA: Insufficient documentation

## 2021-06-01 DIAGNOSIS — E119 Type 2 diabetes mellitus without complications: Secondary | ICD-10-CM | POA: Diagnosis present

## 2021-06-01 DIAGNOSIS — Z833 Family history of diabetes mellitus: Secondary | ICD-10-CM | POA: Diagnosis not present

## 2021-06-01 DIAGNOSIS — R0789 Other chest pain: Secondary | ICD-10-CM | POA: Insufficient documentation

## 2021-06-01 DIAGNOSIS — I700 Atherosclerosis of aorta: Secondary | ICD-10-CM | POA: Insufficient documentation

## 2021-06-01 DIAGNOSIS — E039 Hypothyroidism, unspecified: Secondary | ICD-10-CM | POA: Diagnosis present

## 2021-06-01 DIAGNOSIS — R079 Chest pain, unspecified: Secondary | ICD-10-CM | POA: Diagnosis not present

## 2021-06-01 DIAGNOSIS — N1831 Chronic kidney disease, stage 3a: Secondary | ICD-10-CM | POA: Insufficient documentation

## 2021-06-01 DIAGNOSIS — Z20822 Contact with and (suspected) exposure to covid-19: Secondary | ICD-10-CM | POA: Insufficient documentation

## 2021-06-01 DIAGNOSIS — I129 Hypertensive chronic kidney disease with stage 1 through stage 4 chronic kidney disease, or unspecified chronic kidney disease: Secondary | ICD-10-CM | POA: Insufficient documentation

## 2021-06-01 DIAGNOSIS — M797 Fibromyalgia: Secondary | ICD-10-CM | POA: Insufficient documentation

## 2021-06-01 DIAGNOSIS — G8929 Other chronic pain: Secondary | ICD-10-CM | POA: Diagnosis present

## 2021-06-01 DIAGNOSIS — Z90710 Acquired absence of both cervix and uterus: Secondary | ICD-10-CM | POA: Insufficient documentation

## 2021-06-01 DIAGNOSIS — Z79890 Hormone replacement therapy (postmenopausal): Secondary | ICD-10-CM | POA: Diagnosis not present

## 2021-06-01 DIAGNOSIS — I493 Ventricular premature depolarization: Secondary | ICD-10-CM | POA: Insufficient documentation

## 2021-06-01 DIAGNOSIS — M1990 Unspecified osteoarthritis, unspecified site: Secondary | ICD-10-CM | POA: Diagnosis not present

## 2021-06-01 DIAGNOSIS — R066 Hiccough: Secondary | ICD-10-CM | POA: Diagnosis present

## 2021-06-01 DIAGNOSIS — Z888 Allergy status to other drugs, medicaments and biological substances status: Secondary | ICD-10-CM | POA: Diagnosis not present

## 2021-06-01 DIAGNOSIS — G4730 Sleep apnea, unspecified: Secondary | ICD-10-CM | POA: Diagnosis not present

## 2021-06-01 DIAGNOSIS — E213 Hyperparathyroidism, unspecified: Secondary | ICD-10-CM | POA: Diagnosis present

## 2021-06-01 DIAGNOSIS — R072 Precordial pain: Secondary | ICD-10-CM | POA: Diagnosis present

## 2021-06-01 DIAGNOSIS — Z91048 Other nonmedicinal substance allergy status: Secondary | ICD-10-CM | POA: Diagnosis not present

## 2021-06-01 DIAGNOSIS — J449 Chronic obstructive pulmonary disease, unspecified: Secondary | ICD-10-CM | POA: Diagnosis present

## 2021-06-01 DIAGNOSIS — Z79899 Other long term (current) drug therapy: Secondary | ICD-10-CM | POA: Diagnosis not present

## 2021-06-01 DIAGNOSIS — I10 Essential (primary) hypertension: Secondary | ICD-10-CM | POA: Diagnosis present

## 2021-06-01 DIAGNOSIS — K3184 Gastroparesis: Secondary | ICD-10-CM | POA: Diagnosis present

## 2021-06-01 DIAGNOSIS — I491 Atrial premature depolarization: Secondary | ICD-10-CM | POA: Insufficient documentation

## 2021-06-01 DIAGNOSIS — M8580 Other specified disorders of bone density and structure, unspecified site: Secondary | ICD-10-CM | POA: Insufficient documentation

## 2021-06-01 HISTORY — DX: Other complications of anesthesia, initial encounter: T88.59XA

## 2021-06-01 LAB — APTT: aPTT: 21 seconds — ABNORMAL LOW (ref 24–36)

## 2021-06-01 LAB — COMPREHENSIVE METABOLIC PANEL
ALT: 13 U/L (ref 0–44)
AST: 13 U/L — ABNORMAL LOW (ref 15–41)
Albumin: 3.7 g/dL (ref 3.5–5.0)
Alkaline Phosphatase: 98 U/L (ref 38–126)
Anion gap: 11 (ref 5–15)
BUN: 16 mg/dL (ref 8–23)
CO2: 16 mmol/L — ABNORMAL LOW (ref 22–32)
Calcium: 9.8 mg/dL (ref 8.9–10.3)
Chloride: 105 mmol/L (ref 98–111)
Creatinine, Ser: 1.31 mg/dL — ABNORMAL HIGH (ref 0.44–1.00)
GFR, Estimated: 46 mL/min — ABNORMAL LOW (ref >60)
Glucose, Bld: 116 mg/dL — ABNORMAL HIGH (ref 70–99)
Potassium: 4.5 mmol/L (ref 3.5–5.1)
Sodium: 132 mmol/L — ABNORMAL LOW (ref 135–145)
Total Bilirubin: 0.3 mg/dL (ref 0.3–1.2)
Total Protein: 6 g/dL — ABNORMAL LOW (ref 6.5–8.1)

## 2021-06-01 LAB — CBC
HCT: 37.7 % (ref 36.0–46.0)
Hemoglobin: 11.6 g/dL — ABNORMAL LOW (ref 12.0–15.0)
MCH: 28.7 pg (ref 26.0–34.0)
MCHC: 30.8 g/dL (ref 30.0–36.0)
MCV: 93.3 fL (ref 80.0–100.0)
Platelets: 359 10*3/uL (ref 150–400)
RBC: 4.04 MIL/uL (ref 3.87–5.11)
RDW: 13.6 % (ref 11.5–15.5)
WBC: 14.5 10*3/uL — ABNORMAL HIGH (ref 4.0–10.5)
nRBC: 0 % (ref 0.0–0.2)

## 2021-06-01 LAB — BLOOD GAS, ARTERIAL
Acid-base deficit: 6.4 mmol/L — ABNORMAL HIGH (ref 0.0–2.0)
Bicarbonate: 18.2 mmol/L — ABNORMAL LOW (ref 20.0–28.0)
Drawn by: 58793
FIO2: 21 %
O2 Saturation: 96.6 %
Patient temperature: 37
pCO2 arterial: 33 mmHg (ref 32–48)
pH, Arterial: 7.35 (ref 7.35–7.45)
pO2, Arterial: 93 mmHg (ref 83–108)

## 2021-06-01 LAB — URINALYSIS, ROUTINE W REFLEX MICROSCOPIC
Bilirubin Urine: NEGATIVE
Glucose, UA: NEGATIVE mg/dL
Hgb urine dipstick: NEGATIVE
Ketones, ur: NEGATIVE mg/dL
Nitrite: NEGATIVE
Protein, ur: NEGATIVE mg/dL
Specific Gravity, Urine: 1.011 (ref 1.005–1.030)
pH: 5 (ref 5.0–8.0)

## 2021-06-01 LAB — HEMOGLOBIN A1C
Hgb A1c MFr Bld: 5.5 % (ref 4.8–5.6)
Mean Plasma Glucose: 111 mg/dL

## 2021-06-01 LAB — SURGICAL PCR SCREEN
MRSA, PCR: NEGATIVE
Staphylococcus aureus: NEGATIVE

## 2021-06-01 LAB — TYPE AND SCREEN
ABO/RH(D): A POS
Antibody Screen: NEGATIVE

## 2021-06-01 LAB — PROTIME-INR
INR: 0.9 (ref 0.8–1.2)
Prothrombin Time: 12.2 seconds (ref 11.4–15.2)

## 2021-06-01 LAB — GLUCOSE, CAPILLARY: Glucose-Capillary: 121 mg/dL — ABNORMAL HIGH (ref 70–99)

## 2021-06-01 NOTE — Progress Notes (Addendum)
PCP: Tracie Harrier, MD Cardiologist: Seleta Rhymes - [facility]  EKG: EKG from 05/05/21 requested by Ebony Hail, [place] CXR: 06/01/21 ECHO: 7/22 per patient.   Stress Test: >10 years Cardiac Cath: >10 years  Fasting Blood Sugar- 160's Checks Blood Sugar__2_ times a day  OSA/CPAP: yes, Lost 70 pounds and no longer uses cpap  ASA: per patient, was told to continue Blood Thinner: No  Covid test 06/01/21 at PAT  Anesthesia Review: yes, per MD order. No records need to be requested per [place], [place].   Patient denies shortness of breath, fever, cough, and chest pain at PAT appointment.  Patient verbalized understanding of instructions provided today at the PAT appointment.  Patient asked to review instructions at home and day of surgery.

## 2021-06-01 NOTE — Anesthesia Preprocedure Evaluation (Addendum)
Anesthesia Evaluation  Patient identified by MRN, date of birth, ID band Patient awake    Reviewed: Allergy & Precautions, H&P , NPO status , Patient's Chart, lab work & pertinent test results  Airway Mallampati: III  TM Distance: >3 FB Neck ROM: Full    Dental no notable dental hx. (+) Teeth Intact, Dental Advisory Given   Pulmonary asthma , sleep apnea ,    Pulmonary exam normal breath sounds clear to auscultation       Cardiovascular Exercise Tolerance: Good hypertension, Pt. on medications  Rhythm:Regular Rate:Normal     Neuro/Psych  Neuromuscular disease negative psych ROS   GI/Hepatic negative GI ROS, Neg liver ROS,   Endo/Other  diabetes  Renal/GU negative Renal ROS  negative genitourinary   Musculoskeletal  (+) Arthritis , Osteoarthritis,  Fibromyalgia -  Abdominal   Peds  Hematology negative hematology ROS (+)   Anesthesia Other Findings   Reproductive/Obstetrics negative OB ROS                           Anesthesia Physical Anesthesia Plan  ASA: 3  Anesthesia Plan: MAC   Post-op Pain Management: Tylenol PO (pre-op)*   Induction: Intravenous  PONV Risk Score and Plan: 3 and Propofol infusion, Ondansetron and Dexamethasone  Airway Management Planned: Natural Airway and Simple Face Mask  Additional Equipment:   Intra-op Plan:   Post-operative Plan:   Informed Consent: I have reviewed the patients History and Physical, chart, labs and discussed the procedure including the risks, benefits and alternatives for the proposed anesthesia with the patient or authorized representative who has indicated his/her understanding and acceptance.     Dental advisory given  Plan Discussed with: CRNA  Anesthesia Plan Comments: (Pt seen in PAT as requested, feeling somewhat short of breath but nothing out of the realm of her usual. 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 I discussed with her that her anesthesia team can still take precautions as if she has these conditions. Hopefully the patient will be able to tolerate local/MAC but backup plan will be for GA. Anesthesiologist on day of surgery will also evaluate the patient. )      Anesthesia Quick Evaluation

## 2021-06-02 LAB — SARS CORONAVIRUS 2 (TAT 6-24 HRS): SARS Coronavirus 2: NEGATIVE

## 2021-06-03 ENCOUNTER — Other Ambulatory Visit: Payer: Self-pay

## 2021-06-03 ENCOUNTER — Inpatient Hospital Stay (HOSPITAL_COMMUNITY)
Admission: RE | Admit: 2021-06-03 | Discharge: 2021-06-04 | DRG: 517 | Disposition: A | Discharge status: Home / Self Care | Payer: Medicare HMO | Attending: Thoracic Surgery (Cardiothoracic Vascular Surgery) | Admitting: Thoracic Surgery (Cardiothoracic Vascular Surgery)

## 2021-06-03 ENCOUNTER — Encounter (HOSPITAL_COMMUNITY): Payer: Self-pay | Admitting: Thoracic Surgery (Cardiothoracic Vascular Surgery)

## 2021-06-03 ENCOUNTER — Inpatient Hospital Stay (HOSPITAL_COMMUNITY): Payer: Medicare HMO | Admitting: Vascular Surgery

## 2021-06-03 ENCOUNTER — Encounter (HOSPITAL_COMMUNITY)
Admission: RE | Disposition: A | Discharge status: Home / Self Care | Payer: Self-pay | Source: Home / Self Care | Attending: Thoracic Surgery (Cardiothoracic Vascular Surgery)

## 2021-06-03 DIAGNOSIS — E039 Hypothyroidism, unspecified: Secondary | ICD-10-CM | POA: Diagnosis present

## 2021-06-03 DIAGNOSIS — Z20822 Contact with and (suspected) exposure to covid-19: Secondary | ICD-10-CM | POA: Diagnosis present

## 2021-06-03 DIAGNOSIS — Z881 Allergy status to other antibiotic agents status: Secondary | ICD-10-CM

## 2021-06-03 DIAGNOSIS — Z8249 Family history of ischemic heart disease and other diseases of the circulatory system: Secondary | ICD-10-CM | POA: Diagnosis not present

## 2021-06-03 DIAGNOSIS — E119 Type 2 diabetes mellitus without complications: Secondary | ICD-10-CM | POA: Diagnosis present

## 2021-06-03 DIAGNOSIS — Z885 Allergy status to narcotic agent status: Secondary | ICD-10-CM

## 2021-06-03 DIAGNOSIS — G7000 Myasthenia gravis without (acute) exacerbation: Secondary | ICD-10-CM | POA: Diagnosis present

## 2021-06-03 DIAGNOSIS — R0789 Other chest pain: Secondary | ICD-10-CM | POA: Diagnosis present

## 2021-06-03 DIAGNOSIS — J449 Chronic obstructive pulmonary disease, unspecified: Secondary | ICD-10-CM | POA: Diagnosis present

## 2021-06-03 DIAGNOSIS — G4730 Sleep apnea, unspecified: Secondary | ICD-10-CM | POA: Diagnosis present

## 2021-06-03 DIAGNOSIS — I10 Essential (primary) hypertension: Secondary | ICD-10-CM | POA: Diagnosis not present

## 2021-06-03 DIAGNOSIS — Z833 Family history of diabetes mellitus: Secondary | ICD-10-CM

## 2021-06-03 DIAGNOSIS — M810 Age-related osteoporosis without current pathological fracture: Secondary | ICD-10-CM | POA: Diagnosis present

## 2021-06-03 DIAGNOSIS — Z79890 Hormone replacement therapy (postmenopausal): Secondary | ICD-10-CM

## 2021-06-03 DIAGNOSIS — Z91048 Other nonmedicinal substance allergy status: Secondary | ICD-10-CM | POA: Diagnosis not present

## 2021-06-03 DIAGNOSIS — M899 Disorder of bone, unspecified: Principal | ICD-10-CM | POA: Diagnosis present

## 2021-06-03 DIAGNOSIS — Z888 Allergy status to other drugs, medicaments and biological substances status: Secondary | ICD-10-CM

## 2021-06-03 DIAGNOSIS — E213 Hyperparathyroidism, unspecified: Secondary | ICD-10-CM | POA: Diagnosis present

## 2021-06-03 DIAGNOSIS — G8929 Other chronic pain: Secondary | ICD-10-CM | POA: Diagnosis present

## 2021-06-03 DIAGNOSIS — R072 Precordial pain: Secondary | ICD-10-CM | POA: Diagnosis not present

## 2021-06-03 DIAGNOSIS — G4733 Obstructive sleep apnea (adult) (pediatric): Secondary | ICD-10-CM | POA: Diagnosis present

## 2021-06-03 DIAGNOSIS — K3184 Gastroparesis: Secondary | ICD-10-CM | POA: Diagnosis present

## 2021-06-03 DIAGNOSIS — R066 Hiccough: Secondary | ICD-10-CM | POA: Diagnosis present

## 2021-06-03 DIAGNOSIS — M797 Fibromyalgia: Secondary | ICD-10-CM | POA: Diagnosis present

## 2021-06-03 DIAGNOSIS — M1990 Unspecified osteoarthritis, unspecified site: Secondary | ICD-10-CM | POA: Diagnosis not present

## 2021-06-03 DIAGNOSIS — Z79899 Other long term (current) drug therapy: Secondary | ICD-10-CM | POA: Diagnosis not present

## 2021-06-03 HISTORY — DX: Type 2 diabetes mellitus without complications: E11.9

## 2021-06-03 LAB — GLUCOSE, CAPILLARY
Glucose-Capillary: 94 mg/dL (ref 70–99)
Glucose-Capillary: 81 mg/dL (ref 70–99)
Glucose-Capillary: 88 mg/dL (ref 70–99)
Glucose-Capillary: 149 mg/dL — ABNORMAL HIGH (ref 70–99)

## 2021-06-03 LAB — ABO/RH: ABO/RH(D): A POS

## 2021-06-03 SURGERY — EXCISION, XYPHOID PROCESS
Anesthesia: Monitor Anesthesia Care

## 2021-06-03 MED ORDER — VITAMIN D 25 MCG (1000 UNIT) PO TABS
2000.0000 [IU] | ORAL_TABLET | Freq: Every day | ORAL | Status: DC
  Administered 2021-06-03: 2000 [IU] via ORAL
  Filled 2021-06-03: qty 2

## 2021-06-03 MED ORDER — BUPIVACAINE HCL (PF) 0.5 % IJ SOLN
INTRAMUSCULAR | Status: DC | PRN
  Administered 2021-06-03: 30 mL

## 2021-06-03 MED ORDER — ACETAMINOPHEN 500 MG PO TABS: 500.0000 mg | ORAL_TABLET | Freq: Four times a day (QID) | ORAL | Status: DC | PRN

## 2021-06-03 MED ORDER — TORSEMIDE 20 MG PO TABS
20.0000 mg | ORAL_TABLET | ORAL | Status: DC
  Administered 2021-06-03: 20 mg via ORAL
  Filled 2021-06-03: qty 1

## 2021-06-03 MED ORDER — DIPHENHYDRAMINE HCL 25 MG PO CAPS: 50.0000 mg | ORAL_CAPSULE | ORAL | Status: DC | PRN

## 2021-06-03 MED ORDER — FLUTICASONE PROPIONATE 50 MCG/ACT NA SUSP
2.0000 | Freq: Every day | NASAL | Status: DC
  Filled 2021-06-03: qty 16

## 2021-06-03 MED ORDER — CEFAZOLIN SODIUM-DEXTROSE 2-4 GM/100ML-% IV SOLN
2.0000 g | INTRAVENOUS | Status: AC
  Administered 2021-06-03: 2 g via INTRAVENOUS
  Filled 2021-06-03: qty 100

## 2021-06-03 MED ORDER — RISAQUAD PO CAPS
1.0000 | ORAL_CAPSULE | Freq: Every day | ORAL | Status: DC
  Administered 2021-06-03 – 2021-06-04 (×2): 1 via ORAL
  Filled 2021-06-03 (×2): qty 1

## 2021-06-03 MED ORDER — FENTANYL CITRATE (PF) 250 MCG/5ML IJ SOLN
INTRAMUSCULAR | Status: DC | PRN
  Administered 2021-06-03: 50 ug via INTRAVENOUS
  Administered 2021-06-03: 25 ug via INTRAVENOUS

## 2021-06-03 MED ORDER — METFORMIN HCL 500 MG PO TABS
500.0000 mg | ORAL_TABLET | Freq: Every day | ORAL | Status: DC
Start: 2021-06-04 — End: 2021-06-04
  Administered 2021-06-04: 500 mg via ORAL
  Filled 2021-06-03: qty 1

## 2021-06-03 MED ORDER — LEVOCETIRIZINE DIHYDROCHLORIDE 5 MG PO TABS: 5.0000 mg | ORAL_TABLET | Freq: Every day | ORAL | Status: DC

## 2021-06-03 MED ORDER — BUPIVACAINE LIPOSOME 1.3 % IJ SUSP
INTRAMUSCULAR | Status: AC
  Filled 2021-06-03: qty 20

## 2021-06-03 MED ORDER — PROPOFOL 500 MG/50ML IV EMUL
INTRAVENOUS | Status: DC | PRN
  Administered 2021-06-03: 75 ug/kg/min via INTRAVENOUS

## 2021-06-03 MED ORDER — LORATADINE 10 MG PO TABS
10.0000 mg | ORAL_TABLET | Freq: Every day | ORAL | Status: DC
Start: 2021-06-03 — End: 2021-06-04
  Administered 2021-06-03: 10 mg via ORAL
  Filled 2021-06-03: qty 1

## 2021-06-03 MED ORDER — ROPINIROLE HCL 0.5 MG PO TABS
0.2500 mg | ORAL_TABLET | Freq: Three times a day (TID) | ORAL | Status: DC
  Administered 2021-06-03 – 2021-06-04 (×3): 0.25 mg via ORAL
  Filled 2021-06-03 (×3): qty 1

## 2021-06-03 MED ORDER — DEXAMETHASONE SODIUM PHOSPHATE 10 MG/ML IJ SOLN
INTRAMUSCULAR | Status: DC | PRN
  Administered 2021-06-03: 4 mg via INTRAVENOUS

## 2021-06-03 MED ORDER — POTASSIUM CHLORIDE CRYS ER 20 MEQ PO TBCR
20.0000 meq | EXTENDED_RELEASE_TABLET | Freq: Two times a day (BID) | ORAL | Status: DC
  Administered 2021-06-03 – 2021-06-04 (×2): 20 meq via ORAL
  Filled 2021-06-03 (×2): qty 1

## 2021-06-03 MED ORDER — PROPOFOL 10 MG/ML IV BOLUS
INTRAVENOUS | Status: AC
  Filled 2021-06-03: qty 20

## 2021-06-03 MED ORDER — LACTATED RINGERS IV SOLN: INTRAVENOUS | Status: DC

## 2021-06-03 MED ORDER — BISACODYL 5 MG PO TBEC: 15.0000 mg | DELAYED_RELEASE_TABLET | Freq: Every day | ORAL | Status: DC

## 2021-06-03 MED ORDER — PROMETHAZINE HCL 25 MG PO TABS: 25.0000 mg | ORAL_TABLET | Freq: Four times a day (QID) | ORAL | Status: DC | PRN

## 2021-06-03 MED ORDER — ALBUTEROL SULFATE (2.5 MG/3ML) 0.083% IN NEBU: 2.5000 mg | INHALATION_SOLUTION | Freq: Four times a day (QID) | RESPIRATORY_TRACT | Status: DC | PRN

## 2021-06-03 MED ORDER — HYDROXYZINE HCL 50 MG PO TABS
50.0000 mg | ORAL_TABLET | Freq: Four times a day (QID) | ORAL | Status: DC | PRN
  Filled 2021-06-03: qty 1

## 2021-06-03 MED ORDER — ZINC GLUCONATE 50 MG PO TABS: 50.0000 mg | ORAL_TABLET | Freq: Every day | ORAL | Status: DC

## 2021-06-03 MED ORDER — IRBESARTAN 75 MG PO TABS
37.5000 mg | ORAL_TABLET | Freq: Every day | ORAL | Status: DC
  Administered 2021-06-04: 37.5 mg via ORAL
  Filled 2021-06-03: qty 1

## 2021-06-03 MED ORDER — MIDAZOLAM HCL 2 MG/2ML IJ SOLN
INTRAMUSCULAR | Status: DC | PRN
  Administered 2021-06-03: 1 mg via INTRAVENOUS

## 2021-06-03 MED ORDER — HYDROCORTISONE 2.5 % EX CREA: 1.0000 "application " | TOPICAL_CREAM | Freq: Two times a day (BID) | CUTANEOUS | Status: DC

## 2021-06-03 MED ORDER — PRAVASTATIN SODIUM 10 MG PO TABS: 20.0000 mg | ORAL_TABLET | ORAL | Status: DC

## 2021-06-03 MED ORDER — LACTATED RINGERS IV SOLN: INTRAVENOUS | Status: DC | PRN

## 2021-06-03 MED ORDER — POLYSACCHARIDE IRON COMPLEX 150 MG PO CAPS
150.0000 mg | ORAL_CAPSULE | Freq: Every day | ORAL | Status: DC
  Administered 2021-06-03: 150 mg via ORAL
  Filled 2021-06-03: qty 1

## 2021-06-03 MED ORDER — POLYETHYLENE GLYCOL 3350 17 GM/SCOOP PO POWD: 17.0000 g | Freq: Every day | ORAL | Status: DC

## 2021-06-03 MED ORDER — PANTOPRAZOLE SODIUM 40 MG PO TBEC
80.0000 mg | DELAYED_RELEASE_TABLET | Freq: Every day | ORAL | Status: DC
Start: 2021-06-03 — End: 2021-06-04
  Administered 2021-06-03: 80 mg via ORAL
  Filled 2021-06-03: qty 2

## 2021-06-03 MED ORDER — ROPINIROLE HCL 0.5 MG PO TABS
0.5000 mg | ORAL_TABLET | Freq: Every day | ORAL | Status: DC
  Administered 2021-06-03: 0.5 mg via ORAL
  Filled 2021-06-03: qty 1

## 2021-06-03 MED ORDER — TOPIRAMATE 25 MG PO TABS
150.0000 mg | ORAL_TABLET | Freq: Every day | ORAL | Status: DC
Start: 2021-06-03 — End: 2021-06-04
  Administered 2021-06-03: 150 mg via ORAL
  Filled 2021-06-03 (×2): qty 2

## 2021-06-03 MED ORDER — ASCORBIC ACID 500 MG PO TABS
1000.0000 mg | ORAL_TABLET | Freq: Every day | ORAL | Status: DC
  Administered 2021-06-03: 1000 mg via ORAL
  Filled 2021-06-03: qty 2

## 2021-06-03 MED ORDER — FENTANYL CITRATE (PF) 100 MCG/2ML IJ SOLN
25.0000 ug | INTRAMUSCULAR | Status: DC | PRN
  Administered 2021-06-03 (×3): 25 ug via INTRAVENOUS

## 2021-06-03 MED ORDER — ALBUTEROL SULFATE HFA 108 (90 BASE) MCG/ACT IN AERS: 1.0000 | INHALATION_SPRAY | Freq: Four times a day (QID) | RESPIRATORY_TRACT | Status: DC | PRN

## 2021-06-03 MED ORDER — KETOROLAC TROMETHAMINE 30 MG/ML IJ SOLN
30.0000 mg | Freq: Once | INTRAMUSCULAR | Status: AC
  Administered 2021-06-03: 30 mg via INTRAVENOUS
  Filled 2021-06-03: qty 1

## 2021-06-03 MED ORDER — MIDAZOLAM HCL 2 MG/2ML IJ SOLN
INTRAMUSCULAR | Status: AC
  Filled 2021-06-03: qty 2

## 2021-06-03 MED ORDER — 0.9 % SODIUM CHLORIDE (POUR BTL) OPTIME
TOPICAL | Status: DC | PRN
  Administered 2021-06-03: 1000 mL

## 2021-06-03 MED ORDER — OXYCODONE-ACETAMINOPHEN 5-325 MG PO TABS
1.0000 | ORAL_TABLET | ORAL | Status: DC | PRN
  Administered 2021-06-03: 2 via ORAL
  Administered 2021-06-03: 1 via ORAL
  Administered 2021-06-04: 2 via ORAL
  Filled 2021-06-03: qty 2
  Filled 2021-06-03: qty 1
  Filled 2021-06-03: qty 2

## 2021-06-03 MED ORDER — INSULIN ASPART 100 UNIT/ML IJ SOLN: 0.0000 [IU] | INTRAMUSCULAR | Status: DC | PRN

## 2021-06-03 MED ORDER — MAGNESIUM OXIDE -MG SUPPLEMENT 400 (240 MG) MG PO TABS
800.0000 mg | ORAL_TABLET | Freq: Every day | ORAL | Status: DC
  Administered 2021-06-03: 800 mg via ORAL
  Filled 2021-06-03: qty 2

## 2021-06-03 MED ORDER — RIMEGEPANT SULFATE 75 MG PO TBDP: 75.0000 mg | ORAL_TABLET | ORAL | Status: DC

## 2021-06-03 MED ORDER — ONDANSETRON HCL 4 MG PO TABS: 4.0000 mg | ORAL_TABLET | Freq: Three times a day (TID) | ORAL | Status: DC | PRN

## 2021-06-03 MED ORDER — LEVOTHYROXINE SODIUM 75 MCG PO TABS
150.0000 ug | ORAL_TABLET | Freq: Every day | ORAL | Status: DC
  Administered 2021-06-04: 150 ug via ORAL
  Filled 2021-06-03: qty 2

## 2021-06-03 MED ORDER — METOCLOPRAMIDE HCL 5 MG PO TABS
10.0000 mg | ORAL_TABLET | Freq: Four times a day (QID) | ORAL | Status: DC
  Administered 2021-06-03 – 2021-06-04 (×4): 10 mg via ORAL
  Filled 2021-06-03 (×4): qty 2

## 2021-06-03 MED ORDER — TORSEMIDE 20 MG PO TABS
40.0000 mg | ORAL_TABLET | ORAL | Status: DC
  Administered 2021-06-04: 40 mg via ORAL
  Filled 2021-06-03: qty 2

## 2021-06-03 MED ORDER — PANTOPRAZOLE SODIUM 40 MG PO TBEC
40.0000 mg | DELAYED_RELEASE_TABLET | Freq: Every day | ORAL | Status: DC
Start: 2021-06-04 — End: 2021-06-04
  Administered 2021-06-04: 40 mg via ORAL
  Filled 2021-06-03: qty 1

## 2021-06-03 MED ORDER — TRAMADOL HCL 50 MG PO TABS
50.0000 mg | ORAL_TABLET | Freq: Four times a day (QID) | ORAL | Status: DC
  Administered 2021-06-03 – 2021-06-04 (×4): 50 mg via ORAL
  Filled 2021-06-03 (×4): qty 1

## 2021-06-03 MED ORDER — CHLORHEXIDINE GLUCONATE 0.12 % MT SOLN
OROMUCOSAL | Status: AC
Start: 2021-06-03 — End: 2021-06-03
  Administered 2021-06-03: 15 mL
  Filled 2021-06-03: qty 15

## 2021-06-03 MED ORDER — POLYETHYLENE GLYCOL 3350 17 G PO PACK
17.0000 g | PACK | Freq: Every day | ORAL | Status: DC
  Administered 2021-06-03: 17 g via ORAL
  Filled 2021-06-03: qty 1

## 2021-06-03 MED ORDER — PHENYLEPHRINE HCL-NACL 20-0.9 MG/250ML-% IV SOLN
INTRAVENOUS | Status: DC | PRN
  Administered 2021-06-03: 25 ug/min via INTRAVENOUS

## 2021-06-03 MED ORDER — ACETAMINOPHEN 500 MG PO TABS
1000.0000 mg | ORAL_TABLET | Freq: Once | ORAL | Status: AC
  Administered 2021-06-03: 1000 mg via ORAL
  Filled 2021-06-03: qty 2

## 2021-06-03 MED ORDER — DICLOFENAC SODIUM 1 % EX GEL
2.0000 g | Freq: Four times a day (QID) | CUTANEOUS | Status: DC
  Administered 2021-06-03 – 2021-06-04 (×4): 2 g via TOPICAL
  Filled 2021-06-03: qty 100

## 2021-06-03 MED ORDER — FAMOTIDINE 20 MG PO TABS
20.0000 mg | ORAL_TABLET | Freq: Two times a day (BID) | ORAL | Status: DC
  Administered 2021-06-03 – 2021-06-04 (×2): 20 mg via ORAL
  Filled 2021-06-03 (×2): qty 1

## 2021-06-03 MED ORDER — BUPIVACAINE HCL (PF) 0.5 % IJ SOLN
INTRAMUSCULAR | Status: AC
  Filled 2021-06-03: qty 30

## 2021-06-03 MED ORDER — AZELASTINE HCL 0.1 % NA SOLN
1.0000 | Freq: Two times a day (BID) | NASAL | Status: DC
  Administered 2021-06-03 – 2021-06-04 (×2): 1 via NASAL
  Filled 2021-06-03: qty 30

## 2021-06-03 MED ORDER — ONDANSETRON HCL 4 MG/2ML IJ SOLN
INTRAMUSCULAR | Status: DC | PRN
  Administered 2021-06-03: 4 mg via INTRAVENOUS

## 2021-06-03 MED ORDER — MAGNESIUM 400 MG PO TABS: 1200.0000 mg | ORAL_TABLET | Freq: Every day | ORAL | Status: DC

## 2021-06-03 MED ORDER — INSULIN ASPART 100 UNIT/ML IJ SOLN: 0.0000 [IU] | Freq: Three times a day (TID) | INTRAMUSCULAR | Status: DC

## 2021-06-03 MED ORDER — MONTELUKAST SODIUM 10 MG PO TABS
10.0000 mg | ORAL_TABLET | Freq: Every day | ORAL | Status: DC
  Administered 2021-06-03: 10 mg via ORAL
  Filled 2021-06-03: qty 1

## 2021-06-03 MED ORDER — TOPIRAMATE 100 MG PO TABS
100.0000 mg | ORAL_TABLET | Freq: Every day | ORAL | Status: DC
  Administered 2021-06-04: 100 mg via ORAL
  Filled 2021-06-03: qty 1

## 2021-06-03 MED ORDER — FENTANYL CITRATE (PF) 250 MCG/5ML IJ SOLN
INTRAMUSCULAR | Status: AC
  Filled 2021-06-03: qty 5

## 2021-06-03 MED ORDER — NORTRIPTYLINE HCL 25 MG PO CAPS
50.0000 mg | ORAL_CAPSULE | Freq: Every day | ORAL | Status: DC
  Administered 2021-06-03: 50 mg via ORAL
  Filled 2021-06-03 (×2): qty 2

## 2021-06-03 MED ORDER — SUMATRIPTAN SUCCINATE 100 MG PO TABS
100.0000 mg | ORAL_TABLET | Freq: Every day | ORAL | Status: DC | PRN
Start: 2021-06-03 — End: 2021-06-04

## 2021-06-03 MED ORDER — HYDROXYZINE HCL 25 MG PO TABS: 50.0000 mg | ORAL_TABLET | Freq: Four times a day (QID) | ORAL | Status: DC | PRN

## 2021-06-03 MED ORDER — FENTANYL CITRATE (PF) 100 MCG/2ML IJ SOLN
INTRAMUSCULAR | Status: AC
  Administered 2021-06-03: 25 ug via INTRAVENOUS
  Filled 2021-06-03: qty 2

## 2021-06-03 MED ORDER — VITAMIN B-12 1000 MCG PO TABS
1000.0000 ug | ORAL_TABLET | Freq: Every day | ORAL | Status: DC
  Administered 2021-06-03: 1000 ug via ORAL
  Filled 2021-06-03: qty 1

## 2021-06-03 SURGICAL SUPPLY — 64 items
APPLIER CLIP ROT 10 11.4 M/L (STAPLE)
BIT DRILL 7/64X5 DISP (BIT) IMPLANT
BLADE CLIPPER SURG (BLADE) ×1 IMPLANT
CANISTER SUCT 3000ML PPV (MISCELLANEOUS) ×4 IMPLANT
CATH THORACIC 28FR (CATHETERS) IMPLANT
CATH THORACIC 28FR RT ANG (CATHETERS) IMPLANT
CATH THORACIC 36FR (CATHETERS) IMPLANT
CATH THORACIC 36FR RT ANG (CATHETERS) IMPLANT
CLIP APPLIE ROT 10 11.4 M/L (STAPLE) IMPLANT
CLIP VESOCCLUDE MED 6/CT (CLIP) ×1 IMPLANT
CONN ST 1/4X3/8  BEN (MISCELLANEOUS)
CONN ST 1/4X3/8 BEN (MISCELLANEOUS) IMPLANT
DERMABOND ADVANCED (GAUZE/BANDAGES/DRESSINGS) ×1
DERMABOND ADVANCED .7 DNX12 (GAUZE/BANDAGES/DRESSINGS) IMPLANT
DRAPE LAPAROSCOPIC ABDOMINAL (DRAPES) ×1 IMPLANT
ELECT BLADE 6.5 EXT (BLADE) ×1 IMPLANT
ELECT REM PT RETURN 9FT ADLT (ELECTROSURGICAL) ×2
ELECTRODE REM PT RTRN 9FT ADLT (ELECTROSURGICAL) ×1 IMPLANT
FELT TEFLON 1X6 (MISCELLANEOUS) ×1 IMPLANT
GAUZE 4X4 16PLY ~~LOC~~+RFID DBL (SPONGE) ×1 IMPLANT
GAUZE SPONGE 4X4 12PLY STRL (GAUZE/BANDAGES/DRESSINGS) ×1 IMPLANT
GLOVE SURG SIGNA 7.5 PF LTX (GLOVE) ×6 IMPLANT
GOWN STRL REUS W/ TWL LRG LVL3 (GOWN DISPOSABLE) ×2 IMPLANT
GOWN STRL REUS W/ TWL XL LVL3 (GOWN DISPOSABLE) ×1 IMPLANT
GOWN STRL REUS W/TWL LRG LVL3 (GOWN DISPOSABLE) ×2
GOWN STRL REUS W/TWL XL LVL3 (GOWN DISPOSABLE) ×1
INSERT FOGARTY 61MM (MISCELLANEOUS) IMPLANT
KIT BASIN OR (CUSTOM PROCEDURE TRAY) ×2 IMPLANT
KIT SUCTION CATH 14FR (SUCTIONS) ×1 IMPLANT
KIT TURNOVER KIT B (KITS) ×2 IMPLANT
NS IRRIG 1000ML POUR BTL (IV SOLUTION) ×6 IMPLANT
PACK CHEST (CUSTOM PROCEDURE TRAY) ×2 IMPLANT
PAD ARMBOARD 7.5X6 YLW CONV (MISCELLANEOUS) ×4 IMPLANT
PASSER SUT SWANSON 36MM LOOP (INSTRUMENTS) IMPLANT
SEALANT SURG COSEAL 8ML (VASCULAR PRODUCTS) IMPLANT
SPONGE T-LAP 18X18 ~~LOC~~+RFID (SPONGE) ×6 IMPLANT
SPONGE T-LAP 4X18 ~~LOC~~+RFID (SPONGE) ×1 IMPLANT
SPONGE TONSIL TAPE 1 RFD (DISPOSABLE) ×1 IMPLANT
STAPLER VISISTAT 35W (STAPLE) IMPLANT
STOPCOCK 4 WAY LG BORE MALE ST (IV SETS) ×1 IMPLANT
SUT PDS AB 2-0 CT1 27 (SUTURE) ×1 IMPLANT
SUT PROLENE 2 0 MH 48 (SUTURE) IMPLANT
SUT PROLENE 2 0 SH DA (SUTURE) IMPLANT
SUT PROLENE 3 0 SH 48 (SUTURE) IMPLANT
SUT PROLENE 4 0 RB 1 (SUTURE)
SUT PROLENE 4 0 SH DA (SUTURE) IMPLANT
SUT PROLENE 4-0 RB1 .5 CRCL 36 (SUTURE) ×1 IMPLANT
SUT SILK  1 MH (SUTURE) ×1
SUT SILK 1 MH (SUTURE) ×2 IMPLANT
SUT SILK 2 0SH CR/8 30 (SUTURE) ×1 IMPLANT
SUT VIC AB 1 CTX 18 (SUTURE) IMPLANT
SUT VIC AB 1 CTX 36 (SUTURE) ×1
SUT VIC AB 1 CTX36XBRD ANBCTR (SUTURE) ×1 IMPLANT
SUT VIC AB 3-0 X1 27 (SUTURE) ×2 IMPLANT
SUT VICRYL 2 TP 1 (SUTURE) ×2 IMPLANT
SWAB CULTURE ESWAB REG 1ML (MISCELLANEOUS) IMPLANT
SYR 10ML LL (SYRINGE) ×2 IMPLANT
SYR 20ML LL LF (SYRINGE) IMPLANT
SYR 50ML LL SCALE MARK (SYRINGE) ×1 IMPLANT
SYSTEM SAHARA CHEST DRAIN ATS (WOUND CARE) ×1 IMPLANT
TOWEL GREEN STERILE (TOWEL DISPOSABLE) ×4 IMPLANT
TRAY FOLEY SLVR 16FR LF STAT (SET/KITS/TRAYS/PACK) ×1 IMPLANT
TUBING EXTENTION W/L.L. (IV SETS) IMPLANT
WATER STERILE IRR 1000ML POUR (IV SOLUTION) ×2 IMPLANT

## 2021-06-03 NOTE — Discharge Summary (Addendum)
Physician Discharge Summary       [address]       [place] [address]             [phone]    Patient ID: Betty Peterson MRN: [id number] DOB/AGE: 12/17/1960 61 y.o.  Admit date: 06/03/2021 Discharge date: 06/04/2021  Admission Diagnoses: Prominent xiphoid process with pain Discharge Diagnoses:  S/p xiphoidectomy 2. History of the following: Arthritis      Asthma     Fibromyalgia     Gastroparesis     Hypertension     Myasthenia gravis ([place])     Sleep apnea     Thyroid disease     Consults: None  Procedure (s):  Operative Report    DATE OF PROCEDURE: 06/03/2021   PREOPERATIVE DIAGNOSIS:  Xiphoid pain.   POSTOPERATIVE DIAGNOSIS:  Xiphoid pain.   PROCEDURE:  Xiphoidectomy.   SURGEON:  Revonda Standard. Roxan Hockey, MD   ASSISTANT:  None.   ANESTHESIA:  Local with MAC.   FINDINGS:  Prominent xiphoid process. Hospital Course: This is a is a 61 year old woman with numerous medical problems including myasthenia gravis, possible mitochondrial disease, arthritis, anemia, arrhythmia, asthma, chronic bronchitis, COPD, diabetes, reflux, gastroparesis, fibromyalgia, hypertension, hyperparathyroidism, hypothyroidism, migraines, myalgias, osteoarthritis, osteoporosis, and sleep apnea.  She has been having problems with pain centered over the xiphoid process extending out on both sides dating back about a year.  She had a CT of the chest in September 2022 which showed a bifid xiphoid process with slight irregularity and some stranding of the soft tissue over the left side of it.  Of note there was no thymoma.  She saw thoracic surgeon at [facility].  She was having a lot of other issues at the time.  She now wishes to obtain a second opinion.   She continues to have pain in that area.  Obviously she has numerous medical problems but she says this is one of the things that is most impacting her quality of life currently.  She thinks it may be aggravating her stomach  symptoms with early satiety.  She has had little relief with pain medications or lidocaine patches.  I offered her the option of xiphoid resection.  We can try to do that under local with sedation.  There is a possibility she might require general anesthesia. She has a prominent xiphoid process with pain in that area.  This is distressing to her.  She says it is the biggest issue in terms of her quality of life currently.  She thinks it may be affecting her issues with appetite and early satiety. Dr. Roxan Hockey was quite adamant that it is not affecting her stomach and she should not expect any improvement in those symptoms.  Hospital Course: Patient underwent a xiphoidectomy on 06/03/2021. She was extubated and transferred from the OR to PACU in stable condition.  Postoperatively pain has been adequately controlled.  Vital signs have remained stable and she has been afebrile.  Oxygen saturations are good on room air.  She is voiding well.  She had no postoperative labs.  Shee is tolerating routine activities without significant difficulty.  At the time of discharge she was felt to be quite stable.   Latest Vital Signs: Blood pressure (!) 94/57, pulse 87, temperature 98.4 F (36.9 C), temperature source Oral, resp. rate 18, height _0  (1.651 m), weight 64.4 kg, SpO2 96 %.  Physical Exam: General appearance: alert, cooperative, and no distress Heart: regular rate and rhythm Lungs:  clear to auscultation bilaterally Abdomen: benign Wound: incis healing well   Discharge Condition:Stable and discharged to home.  Recent laboratory studies:  Lab Results  Component Value Date   WBC 14.5 (H) 06/01/2021   HGB 11.6 (L) 06/01/2021   HCT 37.7 06/01/2021   MCV 93.3 06/01/2021   PLT 359 06/01/2021   Lab Results  Component Value Date   NA 132 (L) 06/01/2021   K 4.5 06/01/2021   CL 105 06/01/2021   CO2 16 (L) 06/01/2021   CREATININE 1.31 (H) 06/01/2021   GLUCOSE 116 (H) 06/01/2021       Diagnostic Studies: DG Chest 2 View  Result Date: 06/03/2021 CLINICAL DATA:  Anterior chest pain. EXAM: CHEST - 2 VIEW COMPARISON:  Report of most recent PA and lateral study of 11/18/2020. The films are not available in PACS. FINDINGS: Heart size and central vessels are unremarkable. There is calcification in the aortic arch. Mediastinum is unremarkable. The lungs are clear. No pleural effusion is seen. There is osteopenia and mild thoracic kyphosis. Cholecystectomy clips right upper abdomen. IMPRESSION: No evidence of acute chest disease. Aortic atherosclerosis. Osteopenia. Electronically Signed   By: Telford Nab M.D.   On: 06/03/2021 01:36    Discharge Instructions     Discharge patient   Complete by: As directed    Discharge disposition: 01-Home or Self Care   Discharge patient date: 06/04/2021       Discharge Medications: Allergies as of 06/04/2021       Reactions   Betadine [povidone Iodine] Anaphylaxis   Superficial use causes hives and anaphylaxis; Patient states she is "allergic to the stuff on the skin, but has been ok "when they use it in my veins".     Ambien [zolpidem Tartrate] Other (See Comments)   hallucinations   Avelox [moxifloxacin Hcl In Nacl] Hives   Barium-containing Compounds Other (See Comments)   Patient states she forms barium stones.   Butorphanol Tartrate Other (See Comments)   Carafate [sucralfate] Hives   Morphine And Related Nausea And Vomiting   Prednisone Nausea And Vomiting   Simvastatin Other (See Comments)   Muscle pain   Tape Other (See Comments)   Blisters and burns, can only tolerate paper tape   Tetracyclines & Related Nausea And Vomiting   Biaxin [clarithromycin] Palpitations        Medication List     STOP taking these medications    oxyCODONE-acetaminophen 5-325 MG tablet Commonly known as: PERCOCET/ROXICET   traMADol 50 MG tablet Commonly known as: ULTRAM       TAKE these medications    acetaminophen 500 MG  tablet Commonly known as: TYLENOL Take 500 mg by mouth every 6 (six) hours as needed for moderate pain.   acidophilus Caps capsule Take 1 capsule by mouth daily.   albuterol 108 (90 Base) MCG/ACT inhaler Commonly known as: VENTOLIN HFA Inhale 1-2 puffs into the lungs every 6 (six) hours as needed for wheezing or shortness of breath.   azelastine 0.1 % nasal spray Commonly known as: ASTELIN Place 1 spray into both nostrils in the morning and at bedtime.   bisacodyl 5 MG EC tablet Commonly known as: DULCOLAX Take 15 mg by mouth daily with lunch.   candesartan 4 MG tablet Commonly known as: ATACAND Take 4 mg by mouth at bedtime.   Cholecalciferol 50 MCG (2000 UT) Tabs Take 2,000 Units by mouth daily with lunch.   diclofenac Sodium 1 % Gel Commonly known as: VOLTAREN Apply 2 g topically  4 (four) times daily.   diphenhydrAMINE 25 MG tablet Commonly known as: BENADRYL Take 50 mg by mouth every 4 (four) hours as needed for itching, allergies or sleep.   famotidine 20 MG tablet Commonly known as: PEPCID Take 20 mg by mouth 2 (two) times daily.   fluticasone 50 MCG/ACT nasal spray Commonly known as: FLONASE Place 2 sprays into both nostrils daily.   HYDROcodone-acetaminophen 5-325 MG tablet Commonly known as: NORCO/VICODIN Take 1 tablet by mouth every 6 (six) hours as needed for up to 5 days for moderate pain.   hydrocortisone 2.5 % cream Apply 1 application topically in the morning and at bedtime.   hydrOXYzine 50 MG capsule Commonly known as: VISTARIL Take 50 mg by mouth in the morning and at bedtime.   iron polysaccharides 150 MG capsule Commonly known as: NIFEREX Take 150 mg by mouth at bedtime.   levocetirizine 5 MG tablet Commonly known as: XYZAL Take 5 mg by mouth at bedtime.   levothyroxine 150 MCG tablet Commonly known as: SYNTHROID Take 150 mcg by mouth daily before breakfast. Take on an empty stomach with a glass of water at least 30 minutes before  breakfast.   Magnesium 400 MG Tabs Take 1,200 mg by mouth daily with lunch.   MELATONIN PO Take 20 mg by mouth at bedtime.   metFORMIN 500 MG tablet Commonly known as: GLUCOPHAGE Take 500 mg by mouth daily with breakfast.   metoCLOPramide 10 MG tablet Commonly known as: REGLAN Take 10 mg by mouth 4 (four) times daily.   montelukast 10 MG tablet Commonly known as: SINGULAIR Take 10 mg by mouth at bedtime.   naratriptan 2.5 MG tablet Commonly known as: AMERGE Take 2.5 mg by mouth every other day.   nortriptyline 50 MG capsule Commonly known as: PAMELOR Take 50 mg by mouth at bedtime.   Nurtec 75 MG Tbdp Generic drug: Rimegepant Sulfate Take 75 mg by mouth every other day.   ondansetron 4 MG tablet Commonly known as: ZOFRAN Take 4 mg by mouth in the morning, at noon, in the evening, and at bedtime.   pantoprazole 40 MG tablet Commonly known as: PROTONIX Take 40 mg by mouth See admin instructions. Take 40 mg by mouth in the morning and 80 mg at bedtime   polyethylene glycol powder 17 GM/SCOOP powder Commonly known as: GLYCOLAX/MIRALAX Take 17 g by mouth at bedtime.   Potassium Chloride ER 20 MEQ Tbcr Take 20 mEq by mouth 2 (two) times daily.   pravastatin 20 MG tablet Commonly known as: PRAVACHOL Take 20 mg by mouth every Tuesday, Thursday, and Saturday at 6 PM.   promethazine 25 MG tablet Commonly known as: PHENERGAN Take 25 mg by mouth in the morning, at noon, in the evening, and at bedtime.   rOPINIRole 0.5 MG tablet Commonly known as: REQUIP Take 0.5 mg by mouth at bedtime.   rOPINIRole 0.25 MG tablet Commonly known as: REQUIP Take 0.25 mg by mouth 3 (three) times daily with meals.   topiramate 100 MG tablet Commonly known as: TOPAMAX Take 100-150 mg by mouth See admin instructions. Take 100 mg by mouth in the morning and 150 mg at bedtime   torsemide 20 MG tablet Commonly known as: DEMADEX Take 20-40 mg by mouth See admin instructions. Take 20 mg  by mouth every other day and alternate with 40 mg on alternate days   vitamin B-12 1000 MCG tablet Commonly known as: CYANOCOBALAMIN Take 1,000 mcg by mouth daily with lunch.  vitamin C 1000 MG tablet Take 1,000 mg by mouth at bedtime.   zinc gluconate 50 MG tablet Take 50 mg by mouth daily.        Follow Up Appointments:  Follow-up Information     [name], MD. Go on 06/21/2021.   Specialty: Cardiothoracic Surgery Why: Appointment time is at 9:00 am Contact information: [address] [place] [address] [phone]                 Signed: Gaspar Bidding 06/06/2021, 12:52 PM

## 2021-06-03 NOTE — Anesthesia Procedure Notes (Signed)
Procedure Name: MAC Date/Time: 06/03/2021 7:39 AM Performed by: Carolan Clines, CRNA Pre-anesthesia Checklist: Patient identified, Emergency Drugs available, Suction available and Patient being monitored Patient Re-evaluated:Patient Re-evaluated prior to induction Oxygen Delivery Method: Simple face mask Dental Injury: Teeth and Oropharynx as per pre-operative assessment

## 2021-06-03 NOTE — Transfer of Care (Signed)
Immediate Anesthesia Transfer of Care Note  Patient: Bryli Mantey  Procedure(s) Performed: Removal of Xyphoid Process  Patient Location: PACU  Anesthesia Type:MAC  Level of Consciousness: awake, alert  and oriented  Airway & Oxygen Therapy: Patient Spontanous Breathing  Post-op Assessment: Report given to RN and Post -op Vital signs reviewed and stable  Post vital signs: Reviewed and stable  Last Vitals:  Vitals Value Taken Time  BP 100/67 06/03/21 0841  Temp    Pulse 77 06/03/21 0842  Resp 12 06/03/21 0842  SpO2 99 % 06/03/21 0842  Vitals shown include unvalidated device data.  Last Pain:  Vitals:   06/03/21 0645  TempSrc:   PainSc: 9          Complications: No notable events documented.

## 2021-06-03 NOTE — Interval H&P Note (Signed)
History and Physical Interval Note:  06/03/2021 7:13 AM  Betty Peterson  has presented today for surgery, with the diagnosis of XIPHOID PAIN.  The various methods of treatment have been discussed with the patient and family. After consideration of risks, benefits and other options for treatment, the patient has consented to  Procedure(s): Removal of Xyphoid Process (N/A) as a surgical intervention.  The patient's history has been reviewed, patient examined, no change in status, stable for surgery.  I have reviewed the patient's chart and labs.  Questions were answered to the patient's satisfaction.     [name]

## 2021-06-03 NOTE — Anesthesia Postprocedure Evaluation (Signed)
Anesthesia Post Note  Patient: Betty Peterson  Procedure(s) Performed: Removal of Xyphoid Process     Patient location during evaluation: PACU Anesthesia Type: MAC Level of consciousness: awake and alert Pain management: pain level controlled Vital Signs Assessment: post-procedure vital signs reviewed and stable Respiratory status: spontaneous breathing, nonlabored ventilation and respiratory function stable Cardiovascular status: stable and blood pressure returned to baseline Postop Assessment: no apparent nausea or vomiting Anesthetic complications: no   No notable events documented.  Last Vitals:  Vitals:   06/03/21 1040 06/03/21 1117  BP: 93/69 105/67  Pulse: 73 74  Resp: (!) 21 18  Temp:  36.5 C  SpO2: 97% 97%    Last Pain:  Vitals:   06/03/21 1117  TempSrc: Oral  PainSc: 0-No pain                 Mina Babula,W. EDMOND

## 2021-06-03 NOTE — Discharge Instructions (Signed)
ACTIVITY:  No lifting more than ten pounds for two weeks. 2. No driving until instructed ok to do so by [facility]   WOUND:  1.May shower. 2.Clean wounds with mild soap and water.  Call the office at [phone] if any problems arise.

## 2021-06-03 NOTE — Brief Op Note (Signed)
06/03/2021  8:51 AM  PATIENT:  Betty Peterson  61 y.o. female  PRE-OPERATIVE DIAGNOSIS:  XIPHOID PAIN  POST-OPERATIVE DIAGNOSIS:  XIPHOID PAIN  PROCEDURE:  Procedure(s): Removal of Xyphoid Process (N/A)  SURGEON:  Surgeon(s) and Role:    * [name], MD - Primary  PHYSICIAN ASSISTANT:   ASSISTANTS: none   ANESTHESIA:   local and MAC  EBL:  5 mL   BLOOD ADMINISTERED:none  DRAINS: none   LOCAL MEDICATIONS USED:  MARCAINE    and Amount: 30 ml  SPECIMEN:  Source of Specimen:  xiphoid  DISPOSITION OF SPECIMEN:  PATHOLOGY  COUNTS:  YES  TOURNIQUET:  * No tourniquets in log *  DICTATION: .Other Dictation: Dictation Number -  PLAN OF CARE: Admit to inpatient   PATIENT DISPOSITION:  PACU - hemodynamically stable.   Delay start of Pharmacological VTE agent (>24hrs) due to surgical blood loss or risk of bleeding: not applicable

## 2021-06-03 NOTE — Op Note (Signed)
Betty Peterson, SURBER MEDICAL RECORD NO: [id number] ACCOUNT NO: [id number] DATE OF BIRTH: 1960-10-12 FACILITY: [facility] LOCATION: [facility] PHYSICIAN: Revonda Standard. Roxan Hockey, MD  Operative Report   DATE OF PROCEDURE: 06/03/2021  PREOPERATIVE DIAGNOSIS:  Xiphoid pain.  POSTOPERATIVE DIAGNOSIS:  Xiphoid pain.  PROCEDURE:  Xiphoidectomy.  SURGEON:  Revonda Standard. Roxan Hockey, MD  ASSISTANT:  None.  ANESTHESIA:  Local with MAC.  FINDINGS:  Prominent xiphoid process.  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  She requested surgical resection.  The indications, risks, benefits, and alternatives  were discussed with the patient.  She accepted the risks and agreed to proceed.  DESCRIPTION OF PROCEDURE: [name] was brought to the operating room on 06/03/2021.  She was given intravenous sedation and monitored by the anesthesia team.  Sequential compression devices were placed on the calves for DVT prophylaxis.  Intravenous  antibiotics were administered.  The chest and upper abdomen were prepped and draped in the usual sterile fashion.  A timeout was performed.  Local anesthesia was achieved with 0.5% Marcaine, a total of 30 mL of Marcaine was used during the procedure.  After ensuring adequate local anesthetic effect, an incision was made centered over the xiphoid process.  It was carried  through the skin and subcutaneous tissue.  Hemostasis was achieved.  The xiphoid process then was dissected out.  The muscular and fascial attachments were dissected off and the xiphoid process was removed.  There was a small amount of cartilaginous  material that was debrided with a rongeur.  There was good hemostasis. The wound was copiously irrigated with warm saline.  The incision then was closed with #1 Vicryl fascial suture followed by 2-0 Vicryl subcutaneous suture and a 3-0 Vicryl   subcuticular suture.  Dermabond was applied.  The patient was then transported from the operating room to the postanesthetic care unit in good condition.  All sponge, needle and instrument counts were correct at the end of the procedure.   SHW D: 06/03/2021 5:54:29 pm T: 06/03/2021 10:51:00 pm  JOB: [id number]/ [id number]

## 2021-06-03 NOTE — Hospital Course (Addendum)
Hospital Course: This is a is a 61 year old woman with numerous medical problems including myasthenia gravis, possible mitochondrial disease, arthritis, anemia, arrhythmia, asthma, chronic bronchitis, COPD, diabetes, reflux, gastroparesis, fibromyalgia, hypertension, hyperparathyroidism, hypothyroidism, migraines, myalgias, osteoarthritis, osteoporosis, and sleep apnea.  She has been having problems with pain centered over the xiphoid process extending out on both sides dating back about a year.  She had a CT of the chest in September 2022 which showed a bifid xiphoid process with slight irregularity and some stranding of the soft tissue over the left side of it.  Of note there was no thymoma.  She saw thoracic surgeon at [facility].  She was having a lot of other issues at the time.  She now wishes to obtain a second opinion.   She continues to have pain in that area.  Obviously she has numerous medical problems but she says this is one of the things that is most impacting her quality of life currently.  She thinks it may be aggravating her stomach symptoms with early satiety.  She has had little relief with pain medications or lidocaine patches.  I offered her the option of xiphoid resection.  We can try to do that under local with sedation.  There is a possibility she might require general anesthesia. She has a prominent xiphoid process with pain in that area.  This is distressing to her.  She says it is the biggest issue in terms of her quality of life currently.  She thinks it may be affecting her issues with appetite and early satiety. Dr. Roxan Hockey was quite adamant that it is not affecting her stomach and she should not expect any improvement in those symptoms.  Hospital Course: Patient underwent a xiphoidectomy on 06/03/2021. She was extubated and transferred from the OR to PACU in stable condition.

## 2021-06-04 LAB — GLUCOSE, CAPILLARY
Glucose-Capillary: 97 mg/dL (ref 70–99)
Glucose-Capillary: 94 mg/dL (ref 70–99)

## 2021-06-04 MED ORDER — HYDROCODONE-ACETAMINOPHEN 5-325 MG PO TABS: 1.0000 | ORAL_TABLET | Freq: Four times a day (QID) | ORAL | 0 refills | Status: AC | PRN

## 2021-06-04 NOTE — Plan of Care (Signed)

## 2021-06-04 NOTE — Progress Notes (Signed)
[address]       [place] [id number]             [phone]      1 Day Post-Op Procedure(s) (LRB): Removal of Xyphoid Process (N/A) Subjective: C/o pain, percocet not effective  Objective: Vital signs in last 24 hours: Temp:  [97.7 F (36.5 C)-99 F (37.2 C)] 98.4 F (36.9 C) (02/25 0923) Pulse Rate:  [71-93] 87 (02/25 0923) Cardiac Rhythm: Normal sinus rhythm (02/24 1901) Resp:  [18-22] 18 (02/25 0923) BP: (93-105)/(57-70) 94/57 (02/25 0923) SpO2:  [96 %-99 %] 96 % (02/25 0923)  Hemodynamic parameters for last 24 hours:    Intake/Output from previous day: 02/24 0701 - 02/25 0700 In: 1364.2 [P.O.:720; I.V.:644.2] Out: 5 [Blood:5] Intake/Output this shift: No intake/output data recorded.  General appearance: alert, cooperative, and no distress Heart: regular rate and rhythm Lungs: clear to auscultation bilaterally Abdomen: benign Wound: incis healing well  Lab Results: Recent Labs    06/01/21 1400  WBC 14.5*  HGB 11.6*  HCT 37.7  PLT 359   BMET:  Recent Labs    06/01/21 1400  NA 132*  K 4.5  CL 105  CO2 16*  GLUCOSE 116*  BUN 16  CREATININE 1.31*  CALCIUM 9.8    PT/INR:  Recent Labs    06/01/21 1400  LABPROT 12.2  INR 0.9   ABG    Component Value Date/Time   PHART 7.35 06/01/2021 1432   HCO3 18.2 (L) 06/01/2021 1432   ACIDBASEDEF 6.4 (H) 06/01/2021 1432   O2SAT 96.6 06/01/2021 1432   CBG (last 3)  Recent Labs    06/03/21 1718 06/03/21 2017 06/04/21 0807  GLUCAP 88 149* 97    Meds Scheduled Meds:  acidophilus  1 capsule Oral Daily   vitamin C  1,000 mg Oral QHS   azelastine  1 spray Each Nare BID   bisacodyl  15 mg Oral Q lunch   cholecalciferol  2,000 Units Oral Q lunch   diclofenac Sodium  2 g Topical QID   famotidine  20 mg Oral BID   fluticasone  2 spray Each Nare Daily   hydrocortisone  1 application Topical BID   insulin aspart  0-15 Units Subcutaneous TID WC   irbesartan  37.5 mg Oral Daily    iron polysaccharides  150 mg Oral QHS   levothyroxine  150 mcg Oral Q0600   loratadine  10 mg Oral QHS   magnesium oxide  800 mg Oral Q lunch   metFORMIN  500 mg Oral Q breakfast   metoCLOPramide  10 mg Oral QID   montelukast  10 mg Oral QHS   nortriptyline  50 mg Oral QHS   pantoprazole  40 mg Oral Daily   pantoprazole  80 mg Oral QHS   polyethylene glycol  17 g Oral QHS   potassium chloride SA  20 mEq Oral BID WC   pravastatin  20 mg Oral Q T,Th,Sat-1800   Rimegepant Sulfate  75 mg Oral QODAY   rOPINIRole  0.25 mg Oral TID WC   rOPINIRole  0.5 mg Oral QHS   topiramate  100 mg Oral Daily   topiramate  150 mg Oral QHS   torsemide  20 mg Oral QODAY   torsemide  40 mg Oral QODAY   traMADol  50 mg Oral QID   vitamin B-12  1,000 mcg Oral Q lunch   zinc gluconate  50 mg Oral Daily   Continuous  Infusions:  lactated ringers 50 mL/hr at 06/03/21 1126   PRN Meds:.acetaminophen, albuterol, diphenhydrAMINE, hydrOXYzine, ondansetron, oxyCODONE-acetaminophen, promethazine, SUMAtriptan  Xrays No results found.  Assessment/Plan: S/P Procedure(s) (LRB): Removal of Xyphoid Process (N/A)   POD#1  1 afeb, BP runs a little low but stable 2 sats good on RA 3 voiding well 4 no labs 5 stable for d/c, will try hydrocodone short term for pain    LOS: 1 day    John Giovanni PA-C Pager [phone] 06/04/2021

## 2021-06-06 LAB — SURGICAL PATHOLOGY

## 2021-06-08 DIAGNOSIS — G7000 Myasthenia gravis without (acute) exacerbation: Secondary | ICD-10-CM | POA: Diagnosis not present

## 2021-06-08 DIAGNOSIS — N1832 Chronic kidney disease, stage 3b: Secondary | ICD-10-CM | POA: Diagnosis not present

## 2021-06-08 DIAGNOSIS — M797 Fibromyalgia: Secondary | ICD-10-CM | POA: Diagnosis not present

## 2021-06-08 DIAGNOSIS — R0789 Other chest pain: Secondary | ICD-10-CM | POA: Diagnosis not present

## 2021-06-08 DIAGNOSIS — I129 Hypertensive chronic kidney disease with stage 1 through stage 4 chronic kidney disease, or unspecified chronic kidney disease: Secondary | ICD-10-CM | POA: Diagnosis not present

## 2021-06-08 DIAGNOSIS — L299 Pruritus, unspecified: Secondary | ICD-10-CM | POA: Diagnosis not present

## 2021-06-16 DIAGNOSIS — G7000 Myasthenia gravis without (acute) exacerbation: Secondary | ICD-10-CM | POA: Diagnosis not present

## 2021-06-16 DIAGNOSIS — R202 Paresthesia of skin: Secondary | ICD-10-CM | POA: Diagnosis not present

## 2021-06-16 DIAGNOSIS — E8840 Mitochondrial metabolism disorder, unspecified: Secondary | ICD-10-CM | POA: Diagnosis not present

## 2021-06-16 DIAGNOSIS — G43019 Migraine without aura, intractable, without status migrainosus: Secondary | ICD-10-CM | POA: Diagnosis not present

## 2021-06-16 DIAGNOSIS — G2581 Restless legs syndrome: Secondary | ICD-10-CM | POA: Diagnosis not present

## 2021-06-20 ENCOUNTER — Other Ambulatory Visit: Payer: Self-pay | Admitting: Thoracic Surgery (Cardiothoracic Vascular Surgery)

## 2021-06-20 DIAGNOSIS — R0789 Other chest pain: Secondary | ICD-10-CM

## 2021-06-21 ENCOUNTER — Other Ambulatory Visit: Payer: Self-pay

## 2021-06-21 ENCOUNTER — Ambulatory Visit (INDEPENDENT_AMBULATORY_CARE_PROVIDER_SITE_OTHER): Payer: Self-pay | Admitting: Thoracic Surgery (Cardiothoracic Vascular Surgery)

## 2021-06-21 ENCOUNTER — Ambulatory Visit
Admission: RE | Admit: 2021-06-21 | Discharge: 2021-06-21 | Disposition: A | Discharge status: Home / Self Care | Payer: Medicare HMO | Source: Ambulatory Visit | Attending: Thoracic Surgery (Cardiothoracic Vascular Surgery) | Admitting: Thoracic Surgery (Cardiothoracic Vascular Surgery)

## 2021-06-21 VITALS — BP 112/73 | HR 105 | Resp 20 | Ht 65.0 in | Wt 152.0 lb

## 2021-06-21 DIAGNOSIS — Z09 Encounter for follow-up examination after completed treatment for conditions other than malignant neoplasm: Secondary | ICD-10-CM

## 2021-06-21 DIAGNOSIS — R0789 Other chest pain: Secondary | ICD-10-CM

## 2021-06-21 DIAGNOSIS — R079 Chest pain, unspecified: Secondary | ICD-10-CM | POA: Diagnosis not present

## 2021-06-21 NOTE — Progress Notes (Signed)
? ?   ?[facility].[address] ?      [place] [address] ?            [phone]   ? ? ?HPI: [name] returns for a scheduled follow-up visit ? ?Betty Peterson is a 61 year old woman with numerous medical problems who had a persistently painful prominent xiphoid process. ? ?I did a xiphoidectomy on 06/03/2021.  The procedure went smoothly.  She did stay overnight due to her medical issues but went home the following day. ? ?She is not having much pain at the incision but over the weekend she developed pain along the left costal margin.  She says it hurts when she changes position its improved when she pushes on the area. ? ?Past Medical History:  ?Diagnosis Date  ? Arthritis   ? Asthma   ? Complication of anesthesia   ? History of myasthenia gravis and mitochrondrial variant followed by [facility]; reported hypotension after sedation for CVL 04/2020  ? Diabetes mellitus without complication ([place])   ? Fibromyalgia   ? Gastroparesis   ? Hypertension   ? Myasthenia gravis ([place])   ? Sleep apnea   ? Thyroid disease   ? ? ?Current Outpatient Medications  ?Medication Sig Dispense Refill  ? acetaminophen (TYLENOL) 500 MG tablet Take 500 mg by mouth every 6 (six) hours as needed for moderate pain.    ? acidophilus (RISAQUAD) CAPS capsule Take 1 capsule by mouth daily.    ? albuterol (VENTOLIN HFA) 108 (90 Base) MCG/ACT inhaler Inhale 1-2 puffs into the lungs every 6 (six) hours as needed for wheezing or shortness of breath.    ? Ascorbic Acid (VITAMIN C) 1000 MG tablet Take 1,000 mg by mouth at bedtime.    ? azelastine (ASTELIN) 0.1 % nasal spray Place 1 spray into both nostrils in the morning and at bedtime.    ? bisacodyl (DULCOLAX) 5 MG EC tablet Take 15 mg by mouth daily with lunch.    ? candesartan (ATACAND) 4 MG tablet Take 4 mg by mouth at bedtime.    ? Cholecalciferol 50 MCG (2000 UT) TABS Take 2,000 Units by mouth daily with lunch.    ? diclofenac Sodium (VOLTAREN) 1 % GEL Apply 2 g topically 4  (four) times daily.    ? diphenhydrAMINE (BENADRYL) 25 MG tablet Take 50 mg by mouth every 4 (four) hours as needed for itching, allergies or sleep.    ? famotidine (PEPCID) 20 MG tablet Take 20 mg by mouth 2 (two) times daily.    ? fluticasone (FLONASE) 50 MCG/ACT nasal spray Place 2 sprays into both nostrils daily.    ? hydrocortisone 2.5 % cream Apply 1 application topically in the morning and at bedtime.    ? hydrOXYzine (VISTARIL) 50 MG capsule Take 50 mg by mouth in the morning and at bedtime.    ? iron polysaccharides (NIFEREX) 150 MG capsule Take 150 mg by mouth at bedtime.    ? levocetirizine (XYZAL) 5 MG tablet Take 5 mg by mouth at bedtime.    ? levothyroxine (SYNTHROID, LEVOTHROID) 150 MCG tablet Take 150 mcg by mouth daily before breakfast. Take on an empty stomach with a glass of water at least 30 minutes before breakfast.    ? Magnesium 400 MG TABS Take 1,200 mg by mouth daily with lunch.    ? MELATONIN PO Take 20 mg by mouth at bedtime.    ? metFORMIN (GLUCOPHAGE) 500 MG tablet Take 500 mg by  mouth daily with breakfast.    ? metoCLOPramide (REGLAN) 10 MG tablet Take 10 mg by mouth 4 (four) times daily.    ? montelukast (SINGULAIR) 10 MG tablet Take 10 mg by mouth at bedtime.    ? naratriptan (AMERGE) 2.5 MG tablet Take 2.5 mg by mouth every other day.    ? nortriptyline (PAMELOR) 50 MG capsule Take 50 mg by mouth at bedtime.    ? ondansetron (ZOFRAN) 4 MG tablet Take 4 mg by mouth in the morning, at noon, in the evening, and at bedtime.    ? pantoprazole (PROTONIX) 40 MG tablet Take 40 mg by mouth See admin instructions. Take 40 mg by mouth in the morning and 80 mg at bedtime    ? polyethylene glycol powder (GLYCOLAX/MIRALAX) 17 GM/SCOOP powder Take 17 g by mouth at bedtime.    ? Potassium Chloride ER 20 MEQ TBCR Take 20 mEq by mouth 2 (two) times daily.    ? pravastatin (PRAVACHOL) 20 MG tablet Take 20 mg by mouth every Tuesday, Thursday, and Saturday at 6 PM.    ? promethazine (PHENERGAN) 25 MG  tablet Take 25 mg by mouth in the morning, at noon, in the evening, and at bedtime.    ? Rimegepant Sulfate (NURTEC) 75 MG TBDP Take 75 mg by mouth every other day.    ? rOPINIRole (REQUIP) 0.25 MG tablet Take 0.25 mg by mouth 3 (three) times daily with meals.    ? rOPINIRole (REQUIP) 0.5 MG tablet Take 0.5 mg by mouth at bedtime.    ? topiramate (TOPAMAX) 100 MG tablet Take 100-150 mg by mouth See admin instructions. Take 100 mg by mouth in the morning and 150 mg at bedtime    ? torsemide (DEMADEX) 20 MG tablet Take 20-40 mg by mouth See admin instructions. Take 20 mg by mouth every other day and alternate with 40 mg on alternate days    ? vitamin B-12 (CYANOCOBALAMIN) 1000 MCG tablet Take 1,000 mcg by mouth daily with lunch.    ? zinc gluconate 50 MG tablet Take 50 mg by mouth daily.    ? ?No current facility-administered medications for this visit.  ? ? ?Physical Exam ?BP 112/73 (BP Location: Right Arm, Patient Position: Sitting)   Pulse (!) 105   Resp 20   Ht '5\' 5"'$  (1.651 m)   Wt 152 lb (68.9 kg)   SpO2 95% Comment: RA  BMI 25.49 kg/m?  ?61 year old woman in no acute distress ?Alert and oriented x3 with no focal deficits ?Incision clean dry and intact, no palpable abnormality left costal margin ? ?Diagnostic Tests: ?CHEST - 2 VIEW ?  ?COMPARISON:  Chest x-ray 06/01/2021 ?  ?FINDINGS: ?Heart size and mediastinal contours are within normal limits. No ?suspicious pulmonary opacities identified. ?  ?No pleural effusion or pneumothorax visualized. ?  ?No acute osseous abnormality appreciated. ?  ?IMPRESSION: ?No acute intrathoracic process identified. ?  ?  ?Electronically Signed ?  By: Ofilia Neas M.D. ?FINAL MICROSCOPIC DIAGNOSIS:  ? ?A. BONE, XYPHOID PROCESS, EXCISION:  ?-  ?-  Reactive bone with associated soft tissue  ?-  Bone marrow with trilineage hematopoiesis  ? ? ?Impression: ?Betty Peterson is a 61 year old woman with multiple medical issues who had a persistently painful prominent xiphoid  process.  She underwent a xiphoid resection on 06/03/2021. ? ?Her incision is healing well.  He is not having any significant pain at the surgical site.  She did develop some pain along the left costal  margin over the weekend.  Findings are consistent with musculoskeletal pain. ? ?Advised her to wait until the pain resolves before she attempts to drive.  She should not lift anything over 10 pounds for at least 6 weeks.  Other than that her activities are unrestricted. ? ?Plan: ?Patient will call if she has any issues related to her surgery. ? ?[name], MD ?[facility] ?[phone] ? ? ? ? ?

## 2021-06-28 DIAGNOSIS — N1831 Chronic kidney disease, stage 3a: Secondary | ICD-10-CM | POA: Diagnosis not present

## 2021-07-04 DIAGNOSIS — R0789 Other chest pain: Secondary | ICD-10-CM | POA: Diagnosis not present

## 2021-07-04 DIAGNOSIS — N1832 Chronic kidney disease, stage 3b: Secondary | ICD-10-CM | POA: Diagnosis not present

## 2021-07-04 DIAGNOSIS — R8290 Unspecified abnormal findings in urine: Secondary | ICD-10-CM | POA: Diagnosis not present

## 2021-07-04 DIAGNOSIS — I10 Essential (primary) hypertension: Secondary | ICD-10-CM | POA: Diagnosis not present

## 2021-07-04 DIAGNOSIS — R519 Headache, unspecified: Secondary | ICD-10-CM | POA: Diagnosis not present

## 2021-07-04 DIAGNOSIS — Z131 Encounter for screening for diabetes mellitus: Secondary | ICD-10-CM | POA: Diagnosis not present

## 2021-07-04 DIAGNOSIS — E213 Hyperparathyroidism, unspecified: Secondary | ICD-10-CM | POA: Diagnosis not present

## 2021-07-04 DIAGNOSIS — E1165 Type 2 diabetes mellitus with hyperglycemia: Secondary | ICD-10-CM | POA: Diagnosis not present

## 2021-07-04 DIAGNOSIS — E782 Mixed hyperlipidemia: Secondary | ICD-10-CM | POA: Diagnosis not present

## 2021-07-04 DIAGNOSIS — G7000 Myasthenia gravis without (acute) exacerbation: Secondary | ICD-10-CM | POA: Diagnosis not present

## 2021-07-04 DIAGNOSIS — E039 Hypothyroidism, unspecified: Secondary | ICD-10-CM | POA: Diagnosis not present

## 2021-07-11 ENCOUNTER — Other Ambulatory Visit: Payer: Self-pay | Admitting: Internal Medicine

## 2021-07-11 DIAGNOSIS — N390 Urinary tract infection, site not specified: Secondary | ICD-10-CM | POA: Diagnosis not present

## 2021-07-11 DIAGNOSIS — Z1231 Encounter for screening mammogram for malignant neoplasm of breast: Secondary | ICD-10-CM

## 2021-07-11 DIAGNOSIS — I10 Essential (primary) hypertension: Secondary | ICD-10-CM | POA: Diagnosis not present

## 2021-07-11 DIAGNOSIS — E039 Hypothyroidism, unspecified: Secondary | ICD-10-CM | POA: Diagnosis not present

## 2021-07-11 DIAGNOSIS — E1144 Type 2 diabetes mellitus with diabetic amyotrophy: Secondary | ICD-10-CM | POA: Diagnosis not present

## 2021-07-11 DIAGNOSIS — Z0000 Encounter for general adult medical examination without abnormal findings: Secondary | ICD-10-CM | POA: Diagnosis not present

## 2021-07-11 DIAGNOSIS — R600 Localized edema: Secondary | ICD-10-CM | POA: Diagnosis not present

## 2021-07-11 DIAGNOSIS — E1143 Type 2 diabetes mellitus with diabetic autonomic (poly)neuropathy: Secondary | ICD-10-CM | POA: Diagnosis not present

## 2021-07-11 DIAGNOSIS — M5416 Radiculopathy, lumbar region: Secondary | ICD-10-CM | POA: Diagnosis not present

## 2021-07-19 DIAGNOSIS — Z8739 Personal history of other diseases of the musculoskeletal system and connective tissue: Secondary | ICD-10-CM | POA: Diagnosis not present

## 2021-07-19 DIAGNOSIS — G713 Mitochondrial myopathy, not elsewhere classified: Secondary | ICD-10-CM | POA: Diagnosis not present

## 2021-07-19 DIAGNOSIS — M797 Fibromyalgia: Secondary | ICD-10-CM | POA: Diagnosis not present

## 2021-07-25 DIAGNOSIS — G729 Myopathy, unspecified: Secondary | ICD-10-CM | POA: Diagnosis not present

## 2021-07-25 DIAGNOSIS — R531 Weakness: Secondary | ICD-10-CM | POA: Diagnosis not present

## 2021-07-28 DIAGNOSIS — G737 Myopathy in diseases classified elsewhere: Secondary | ICD-10-CM | POA: Diagnosis not present

## 2021-07-28 DIAGNOSIS — G713 Mitochondrial myopathy, not elsewhere classified: Secondary | ICD-10-CM | POA: Diagnosis not present

## 2021-07-29 DIAGNOSIS — R634 Abnormal weight loss: Secondary | ICD-10-CM | POA: Diagnosis not present

## 2021-07-29 DIAGNOSIS — A689 Relapsing fever, unspecified: Secondary | ICD-10-CM | POA: Diagnosis not present

## 2021-08-09 DIAGNOSIS — Z79899 Other long term (current) drug therapy: Secondary | ICD-10-CM | POA: Diagnosis not present

## 2021-08-09 DIAGNOSIS — R634 Abnormal weight loss: Secondary | ICD-10-CM | POA: Diagnosis not present

## 2021-08-09 DIAGNOSIS — A689 Relapsing fever, unspecified: Secondary | ICD-10-CM | POA: Diagnosis not present

## 2021-08-09 DIAGNOSIS — K6389 Other specified diseases of intestine: Secondary | ICD-10-CM | POA: Diagnosis not present

## 2021-08-16 DIAGNOSIS — E785 Hyperlipidemia, unspecified: Secondary | ICD-10-CM | POA: Diagnosis not present

## 2021-08-16 DIAGNOSIS — R5383 Other fatigue: Secondary | ICD-10-CM | POA: Diagnosis not present

## 2021-08-16 DIAGNOSIS — R5381 Other malaise: Secondary | ICD-10-CM | POA: Diagnosis not present

## 2021-08-16 DIAGNOSIS — I10 Essential (primary) hypertension: Secondary | ICD-10-CM | POA: Diagnosis not present

## 2021-08-16 DIAGNOSIS — R0789 Other chest pain: Secondary | ICD-10-CM | POA: Diagnosis not present

## 2021-08-16 DIAGNOSIS — R948 Abnormal results of function studies of other organs and systems: Secondary | ICD-10-CM | POA: Diagnosis not present

## 2021-08-16 DIAGNOSIS — E039 Hypothyroidism, unspecified: Secondary | ICD-10-CM | POA: Diagnosis not present

## 2021-08-16 DIAGNOSIS — D649 Anemia, unspecified: Secondary | ICD-10-CM | POA: Diagnosis not present

## 2021-08-16 DIAGNOSIS — G713 Mitochondrial myopathy, not elsewhere classified: Secondary | ICD-10-CM | POA: Diagnosis not present

## 2021-08-17 DIAGNOSIS — Z1331 Encounter for screening for depression: Secondary | ICD-10-CM | POA: Diagnosis not present

## 2021-08-17 DIAGNOSIS — R509 Fever, unspecified: Secondary | ICD-10-CM | POA: Diagnosis not present

## 2021-08-30 DIAGNOSIS — H04123 Dry eye syndrome of bilateral lacrimal glands: Secondary | ICD-10-CM | POA: Diagnosis not present

## 2021-08-30 DIAGNOSIS — H5034 Intermittent alternating exotropia: Secondary | ICD-10-CM | POA: Diagnosis not present

## 2021-08-30 DIAGNOSIS — H532 Diplopia: Secondary | ICD-10-CM | POA: Diagnosis not present

## 2021-09-06 DIAGNOSIS — K3184 Gastroparesis: Secondary | ICD-10-CM | POA: Diagnosis not present

## 2021-09-06 DIAGNOSIS — R634 Abnormal weight loss: Secondary | ICD-10-CM | POA: Diagnosis not present

## 2021-09-06 DIAGNOSIS — R1013 Epigastric pain: Secondary | ICD-10-CM | POA: Diagnosis not present

## 2021-09-06 DIAGNOSIS — R948 Abnormal results of function studies of other organs and systems: Secondary | ICD-10-CM | POA: Diagnosis not present

## 2021-09-06 DIAGNOSIS — K219 Gastro-esophageal reflux disease without esophagitis: Secondary | ICD-10-CM | POA: Diagnosis not present

## 2021-09-08 ENCOUNTER — Ambulatory Visit
Admission: RE | Admit: 2021-09-08 | Discharge: 2021-09-08 | Disposition: A | Discharge status: Home / Self Care | Payer: Medicare HMO | Source: Ambulatory Visit | Attending: Internal Medicine | Admitting: Internal Medicine

## 2021-09-08 DIAGNOSIS — Z1231 Encounter for screening mammogram for malignant neoplasm of breast: Secondary | ICD-10-CM | POA: Diagnosis not present

## 2021-09-15 DIAGNOSIS — D2261 Melanocytic nevi of right upper limb, including shoulder: Secondary | ICD-10-CM | POA: Diagnosis not present

## 2021-09-15 DIAGNOSIS — D2272 Melanocytic nevi of left lower limb, including hip: Secondary | ICD-10-CM | POA: Diagnosis not present

## 2021-09-15 DIAGNOSIS — D225 Melanocytic nevi of trunk: Secondary | ICD-10-CM | POA: Diagnosis not present

## 2021-09-15 DIAGNOSIS — D2271 Melanocytic nevi of right lower limb, including hip: Secondary | ICD-10-CM | POA: Diagnosis not present

## 2021-09-15 DIAGNOSIS — D2262 Melanocytic nevi of left upper limb, including shoulder: Secondary | ICD-10-CM | POA: Diagnosis not present

## 2021-09-15 DIAGNOSIS — L659 Nonscarring hair loss, unspecified: Secondary | ICD-10-CM | POA: Diagnosis not present

## 2021-09-15 DIAGNOSIS — L299 Pruritus, unspecified: Secondary | ICD-10-CM | POA: Diagnosis not present

## 2021-09-15 DIAGNOSIS — L814 Other melanin hyperpigmentation: Secondary | ICD-10-CM | POA: Diagnosis not present

## 2021-09-15 DIAGNOSIS — L821 Other seborrheic keratosis: Secondary | ICD-10-CM | POA: Diagnosis not present

## 2021-09-22 ENCOUNTER — Encounter: Payer: Self-pay | Admitting: Gastroenterology

## 2021-09-23 ENCOUNTER — Ambulatory Visit: Payer: Medicare HMO | Admitting: Certified Registered"

## 2021-09-23 ENCOUNTER — Ambulatory Visit
Admission: RE | Admit: 2021-09-23 | Discharge: 2021-09-23 | Disposition: A | Discharge status: Home / Self Care | Payer: Medicare HMO | Source: Ambulatory Visit | Attending: Gastroenterology | Admitting: Gastroenterology

## 2021-09-23 ENCOUNTER — Encounter
Admission: RE | Disposition: A | Discharge status: Home / Self Care | Payer: Self-pay | Source: Ambulatory Visit | Attending: Gastroenterology

## 2021-09-23 ENCOUNTER — Encounter: Payer: Self-pay | Admitting: Gastroenterology

## 2021-09-23 DIAGNOSIS — G7000 Myasthenia gravis without (acute) exacerbation: Secondary | ICD-10-CM | POA: Insufficient documentation

## 2021-09-23 DIAGNOSIS — K317 Polyp of stomach and duodenum: Secondary | ICD-10-CM | POA: Insufficient documentation

## 2021-09-23 DIAGNOSIS — T182XXA Foreign body in stomach, initial encounter: Secondary | ICD-10-CM | POA: Diagnosis not present

## 2021-09-23 DIAGNOSIS — R110 Nausea: Secondary | ICD-10-CM | POA: Insufficient documentation

## 2021-09-23 DIAGNOSIS — Z9049 Acquired absence of other specified parts of digestive tract: Secondary | ICD-10-CM | POA: Diagnosis not present

## 2021-09-23 DIAGNOSIS — Z7984 Long term (current) use of oral hypoglycemic drugs: Secondary | ICD-10-CM | POA: Insufficient documentation

## 2021-09-23 DIAGNOSIS — I10 Essential (primary) hypertension: Secondary | ICD-10-CM | POA: Insufficient documentation

## 2021-09-23 DIAGNOSIS — G4730 Sleep apnea, unspecified: Secondary | ICD-10-CM | POA: Insufficient documentation

## 2021-09-23 DIAGNOSIS — E559 Vitamin D deficiency, unspecified: Secondary | ICD-10-CM | POA: Insufficient documentation

## 2021-09-23 DIAGNOSIS — Z79899 Other long term (current) drug therapy: Secondary | ICD-10-CM | POA: Insufficient documentation

## 2021-09-23 DIAGNOSIS — Q438 Other specified congenital malformations of intestine: Secondary | ICD-10-CM | POA: Diagnosis not present

## 2021-09-23 DIAGNOSIS — R634 Abnormal weight loss: Secondary | ICD-10-CM | POA: Insufficient documentation

## 2021-09-23 DIAGNOSIS — Z800 Family history of malignant neoplasm of digestive organs: Secondary | ICD-10-CM | POA: Diagnosis not present

## 2021-09-23 DIAGNOSIS — J449 Chronic obstructive pulmonary disease, unspecified: Secondary | ICD-10-CM | POA: Insufficient documentation

## 2021-09-23 DIAGNOSIS — R933 Abnormal findings on diagnostic imaging of other parts of digestive tract: Secondary | ICD-10-CM | POA: Diagnosis not present

## 2021-09-23 DIAGNOSIS — K3184 Gastroparesis: Secondary | ICD-10-CM | POA: Insufficient documentation

## 2021-09-23 DIAGNOSIS — E039 Hypothyroidism, unspecified: Secondary | ICD-10-CM | POA: Insufficient documentation

## 2021-09-23 DIAGNOSIS — R948 Abnormal results of function studies of other organs and systems: Secondary | ICD-10-CM | POA: Diagnosis not present

## 2021-09-23 DIAGNOSIS — Z6823 Body mass index (BMI) 23.0-23.9, adult: Secondary | ICD-10-CM | POA: Diagnosis not present

## 2021-09-23 DIAGNOSIS — K219 Gastro-esophageal reflux disease without esophagitis: Secondary | ICD-10-CM | POA: Insufficient documentation

## 2021-09-23 DIAGNOSIS — R519 Headache, unspecified: Secondary | ICD-10-CM | POA: Diagnosis not present

## 2021-09-23 DIAGNOSIS — Z1211 Encounter for screening for malignant neoplasm of colon: Secondary | ICD-10-CM | POA: Insufficient documentation

## 2021-09-23 DIAGNOSIS — E1143 Type 2 diabetes mellitus with diabetic autonomic (poly)neuropathy: Secondary | ICD-10-CM | POA: Diagnosis not present

## 2021-09-23 HISTORY — DX: Other mitochondrial metabolism disorders: E88.49

## 2021-09-23 HISTORY — PX: ESOPHAGOGASTRODUODENOSCOPY (EGD) WITH PROPOFOL: SHX5813

## 2021-09-23 HISTORY — DX: Headache, unspecified: R51.9

## 2021-09-23 HISTORY — PX: COLONOSCOPY WITH PROPOFOL: SHX5780

## 2021-09-23 HISTORY — DX: Hypothyroidism, unspecified: E03.9

## 2021-09-23 HISTORY — DX: Vitamin D deficiency, unspecified: E55.9

## 2021-09-23 HISTORY — DX: Chronic obstructive pulmonary disease, unspecified: J44.9

## 2021-09-23 HISTORY — DX: Anemia, unspecified: D64.9

## 2021-09-23 HISTORY — DX: Aortic aneurysm of unspecified site, without rupture: I71.9

## 2021-09-23 SURGERY — COLONOSCOPY WITH PROPOFOL
Anesthesia: General

## 2021-09-23 MED ORDER — PROPOFOL 10 MG/ML IV BOLUS
INTRAVENOUS | Status: AC
  Filled 2021-09-23: qty 20

## 2021-09-23 MED ORDER — VASOPRESSIN 20 UNIT/ML IV SOLN
INTRAVENOUS | Status: DC | PRN
  Administered 2021-09-23: .5 [IU] via INTRAVENOUS
  Administered 2021-09-23: 1 [IU] via INTRAVENOUS
  Administered 2021-09-23 (×2): .5 [IU] via INTRAVENOUS

## 2021-09-23 MED ORDER — SODIUM CHLORIDE 0.9 % IV SOLN: INTRAVENOUS | Status: DC

## 2021-09-23 MED ORDER — PHENYLEPHRINE HCL (PRESSORS) 10 MG/ML IV SOLN
INTRAVENOUS | Status: DC | PRN
  Administered 2021-09-23 (×6): 80 ug via INTRAVENOUS

## 2021-09-23 MED ORDER — PROPOFOL 10 MG/ML IV BOLUS
INTRAVENOUS | Status: DC | PRN
  Administered 2021-09-23: 50 mg via INTRAVENOUS

## 2021-09-23 MED ORDER — EPHEDRINE SULFATE (PRESSORS) 50 MG/ML IJ SOLN
INTRAMUSCULAR | Status: DC | PRN
  Administered 2021-09-23: 5 mg via INTRAVENOUS
  Administered 2021-09-23 (×2): 10 mg via INTRAVENOUS

## 2021-09-23 MED ORDER — LIDOCAINE HCL (CARDIAC) PF 100 MG/5ML IV SOSY
PREFILLED_SYRINGE | INTRAVENOUS | Status: DC | PRN
  Administered 2021-09-23: 50 mg via INTRAVENOUS

## 2021-09-23 MED ORDER — PROPOFOL 500 MG/50ML IV EMUL
INTRAVENOUS | Status: DC | PRN
  Administered 2021-09-23: 100 ug/kg/min via INTRAVENOUS

## 2021-09-23 NOTE — Anesthesia Postprocedure Evaluation (Signed)
Anesthesia Post Note  Patient: Petrona Wyeth  Procedure(s) Performed: COLONOSCOPY WITH PROPOFOL ESOPHAGOGASTRODUODENOSCOPY (EGD) WITH PROPOFOL  Patient location during evaluation: Endoscopy Anesthesia Type: General Level of consciousness: awake and alert Pain management: pain level controlled Vital Signs Assessment: post-procedure vital signs reviewed and stable Respiratory status: spontaneous breathing, nonlabored ventilation, respiratory function stable and patient connected to nasal cannula oxygen Cardiovascular status: blood pressure returned to baseline and stable Postop Assessment: no apparent nausea or vomiting Anesthetic complications: no   No notable events documented.   Last Vitals:  Vitals:   09/23/21 1339 09/23/21 1345  BP: 101/74 103/68  Pulse: 75 76  Resp: 13 15  Temp:    SpO2: 98% 96%    Last Pain:  Vitals:   09/23/21 1345  TempSrc:   PainSc: 8                  Precious Haws Daviana Haymaker

## 2021-09-23 NOTE — Transfer of Care (Signed)
Immediate Anesthesia Transfer of Care Note  Patient: Mikia Delaluz  Procedure(s) Performed: COLONOSCOPY WITH PROPOFOL ESOPHAGOGASTRODUODENOSCOPY (EGD) WITH PROPOFOL  Patient Location: PACU and Endoscopy Unit  Anesthesia Type:General  Level of Consciousness: awake  Airway & Oxygen Therapy: Patient Spontanous Breathing  Post-op Assessment: Report given to RN and Post -op Vital signs reviewed and stable  Post vital signs: Reviewed and stable  Last Vitals:  Vitals Value Taken Time  BP    Temp    Pulse    Resp    SpO2      Last Pain:  Vitals:   09/23/21 1146  TempSrc: Temporal  PainSc: 8          Complications: No notable events documented.

## 2021-09-23 NOTE — H&P (Signed)
Pre-Procedure H&P   Patient ID: Betty Peterson is a 61 y.o. female.  Gastroenterology Provider: [name], DO  Referring Provider: Stephens November, NP PCP: Tracie Harrier, MD  Date: 09/23/2021  HPI Ms. Betty Peterson is a 61 y.o. female who presents today for Esophagogastroduodenoscopy and Colonoscopy for abnormal pet scan, unintentional weight loss, chronic nausea, gerd.  Patient with 70 pound weight loss unintentionally.  Notes chronic nausea and reflux related to her gastroparesis.  She reports she is taking 40 mg of Protonix in the morning and 80 mg in the afternoon along with twice a day Pepcid that has been prescribed by her PCP.  Previous EGD and colonoscopy between 2011 2013 demonstrating fundic gland polyps negative for EOE.  Negative for colon polyps  Creatinine 1.2 hemoglobin 10.8 MCV 96 platelets 262,000  PET scan in May demonstrating rectosigmoid and descending colon hypermetabolism/increased uptake which can be nonspecific physiologic activity  Recently had her xiphoid process removed.  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 Negative for activity change, chills, diaphoresis, fatigue and fever.  HENT:  Negative for trouble swallowing and voice change.   Respiratory:  Negative for shortness of breath and wheezing.   Cardiovascular:  Negative for chest pain, palpitations and leg swelling.  Gastrointestinal:  Positive for abdominal pain and nausea. Negative for abdominal distention, anal bleeding, blood in stool, constipation, diarrhea, rectal pain and vomiting.  Musculoskeletal:  Negative for arthralgias and myalgias.  Skin:  Negative for color change and pallor.  Neurological:  Negative for dizziness, syncope and weakness.  Psychiatric/Behavioral:  Negative for confusion.   All other systems reviewed and are negative.    Medications No current facility-administered medications on file prior to encounter.   Current Outpatient Medications on File Prior to Encounter  Medication Sig Dispense Refill   famotidine (PEPCID) 20 MG tablet Take 20 mg by mouth 2 (two) times daily.     hydrOXYzine (VISTARIL) 50 MG capsule Take 50 mg by mouth in the morning and at bedtime.     iron polysaccharides (NIFEREX) 150 MG capsule Take 150 mg by mouth at bedtime.     levothyroxine (SYNTHROID, LEVOTHROID) 150 MCG tablet Take 150 mcg by mouth daily before breakfast. Take on an empty stomach with a glass of water at least 30 minutes before breakfast.     Magnesium 400 MG TABS  Take 1,200 mg by mouth daily with lunch.     ondansetron (ZOFRAN) 4 MG tablet Take 4 mg by mouth in the morning, at noon, in the evening, and at bedtime.     pantoprazole (PROTONIX) 40 MG tablet Take 40 mg by mouth See admin instructions. Take 40 mg by mouth in the morning and 80 mg at bedtime     Potassium Chloride ER 20 MEQ TBCR Take 20 mEq by mouth 2 (two) times daily.     promethazine (PHENERGAN) 25 MG  tablet Take 25 mg by mouth in the morning, at noon, in the evening, and at bedtime.     topiramate (TOPAMAX) 100 MG tablet Take 100-150 mg by mouth See admin instructions. Take 100 mg by mouth in the morning and 150 mg at bedtime     torsemide (DEMADEX) 20 MG tablet Take 20-40 mg by mouth See admin instructions. Take 20 mg by mouth every other day and alternate with 40 mg on alternate days     acetaminophen (TYLENOL) 500 MG tablet Take 500 mg by mouth every 6 (six) hours as needed for moderate pain.     acidophilus (RISAQUAD) CAPS capsule Take 1 capsule by mouth daily.     albuterol (VENTOLIN HFA) 108 (90 Base) MCG/ACT inhaler Inhale 1-2 puffs into the lungs every 6 (six) hours as needed for wheezing or shortness of breath.     Ascorbic Acid (VITAMIN C) 1000 MG tablet Take 1,000 mg by mouth at bedtime.     azelastine (ASTELIN) 0.1 % nasal spray Place 1 spray into both nostrils in the morning and at bedtime.     bisacodyl (DULCOLAX) 5 MG EC tablet Take 15 mg by mouth daily with lunch.     candesartan (ATACAND) 4 MG tablet Take 4 mg by mouth at bedtime.     Cholecalciferol 50 MCG (2000 UT) TABS Take 2,000 Units by mouth daily with lunch.     diclofenac Sodium (VOLTAREN) 1 % GEL Apply 2 g topically 4 (four) times daily.     diphenhydrAMINE (BENADRYL) 25 MG tablet Take 50 mg by mouth every 4 (four) hours as needed for itching, allergies or sleep.     fluticasone (FLONASE) 50 MCG/ACT nasal spray Place 2 sprays into both nostrils daily.     hydrocortisone 2.5 % cream Apply 1 application topically in the morning and at bedtime.     levocetirizine (XYZAL) 5 MG tablet Take 5 mg by mouth at bedtime.     MELATONIN PO Take 20 mg by mouth at bedtime.     metFORMIN (GLUCOPHAGE) 500 MG tablet Take 500 mg by mouth daily with breakfast. (Patient not taking: Reported on 09/23/2021)     metoCLOPramide (REGLAN) 10 MG tablet Take 10 mg by mouth 4 (four) times daily.     montelukast (SINGULAIR) 10 MG tablet Take 10 mg  by mouth at bedtime.     naratriptan (AMERGE) 2.5 MG tablet Take 2.5 mg by mouth every other day.     nortriptyline (PAMELOR) 50 MG capsule Take 50 mg by mouth at bedtime.     polyethylene glycol powder (GLYCOLAX/MIRALAX) 17 GM/SCOOP powder Take 17 g by mouth at bedtime.     pravastatin (PRAVACHOL) 20 MG tablet Take 20 mg by mouth every Tuesday, Thursday, and Saturday at 6 PM.     Rimegepant Sulfate (NURTEC) 75 MG TBDP Take 75 mg by mouth every other day.     rOPINIRole (REQUIP) 0.25 MG tablet Take 0.25 mg by mouth  3 (three) times daily with meals.     rOPINIRole (REQUIP) 0.5 MG tablet Take 0.5 mg by mouth at bedtime.     vitamin B-12 (CYANOCOBALAMIN) 1000 MCG tablet Take 1,000 mcg by mouth daily with lunch.     zinc gluconate 50 MG tablet Take 50 mg by mouth daily.      Pertinent medications related to GI and procedure were reviewed by me with the patient prior to the procedure   Current Facility-Administered Medications:    0.9 %  sodium chloride infusion, , Intravenous, Continuous, [name], DO      Allergies  Allergen Reactions   Betadine [Povidone Iodine] Anaphylaxis    Superficial use causes hives and anaphylaxis; Patient states she is "allergic to the stuff on the skin, but has been ok "when they use it in my veins".     Ambien [Zolpidem Tartrate] Other (See Comments)    hallucinations   Avelox [Moxifloxacin Hcl In Nacl] Hives   Barium-Containing Compounds Other (See Comments)    Patient states she forms barium stones.   Butorphanol Tartrate Other (See Comments)   Carafate [Sucralfate] Hives   Ciprofloxacin    Imuran [Azathioprine]    Influenza Virus Vaccine    Morphine And Related Nausea And Vomiting   Penicillins    Prednisone Nausea And Vomiting   Simvastatin Other (See Comments)    Muscle pain   Tape Other (See Comments)    Blisters and burns, can only tolerate paper tape   Tetracyclines & Related Nausea And Vomiting   Biaxin [Clarithromycin]  Palpitations   Allergies were reviewed by me prior to the procedure  Objective   Body mass index is 23.37 kg/m. Vitals:   09/23/21 1146  BP: 100/65  Pulse: 88  Resp: 18  Temp: (!) 96.4 F (35.8 C)  TempSrc: Temporal  SpO2: 100%  Weight: 66.7 kg  Height: 5' 6.5" (1.689 m)     Physical Exam Vitals and nursing note reviewed.  Constitutional:      General: She is not in acute distress.    Appearance: Normal appearance. She is not ill-appearing, toxic-appearing or diaphoretic.  HENT:     Head: Normocephalic and atraumatic.     Nose: Nose normal.     Mouth/Throat:     Comments: Wearing mask Eyes:     General: No scleral icterus.    Extraocular Movements: Extraocular movements intact.  Cardiovascular:     Rate and Rhythm: Normal rate and regular rhythm.     Heart sounds: Normal heart sounds. No murmur heard.    No friction rub. No gallop.  Pulmonary:     Effort: Pulmonary effort is normal. No respiratory distress.     Breath sounds: Normal breath sounds. No wheezing, rhonchi or rales.  Abdominal:     General: Bowel sounds are normal. There is no distension.     Palpations: Abdomen is soft.     Tenderness: There is no abdominal tenderness. There is no guarding or rebound.  Musculoskeletal:     Cervical back: Neck supple.     Right lower leg: No edema.     Left lower leg: No edema.  Skin:    General: Skin is warm and dry.     Coloration: Skin is not jaundiced or pale.  Neurological:     General: No focal deficit present.     Mental Status: She is alert and oriented to person, place, and time. Mental status is at baseline.  Psychiatric:  Mood and Affect: Mood normal.        Behavior: Behavior normal.        Thought Content: Thought content normal.        Judgment: Judgment normal.      Assessment:  Ms. Betty Peterson is a 61 y.o. female  who presents today for Esophagogastroduodenoscopy and Colonoscopy for abnormal pet scan, unintentional weight loss,  chronic nausea, gerd.  Plan:  Esophagogastroduodenoscopy and Colonoscopy with possible intervention today  Esophagogastroduodenoscopy and Colonoscopy with possible biopsy, control of bleeding, polypectomy, and interventions as necessary has been discussed with the patient/patient representative. Informed consent was obtained from the patient/patient representative after explaining the indication, nature, and risks of the procedure including but not limited to death, bleeding, perforation, missed neoplasm/lesions, cardiorespiratory compromise, and reaction to medications. Opportunity for questions was given and appropriate answers were provided. Patient/patient representative has verbalized understanding is amenable to undergoing the procedure.   [name], DO  [facility]  Portions of the record may have been created with voice recognition software. Occasional wrong-word or 'sound-a-like' substitutions may have occurred due to the inherent limitations of voice recognition software.  Read the chart carefully and recognize, using context, where substitutions may have occurred.

## 2021-09-23 NOTE — Anesthesia Procedure Notes (Addendum)
Procedure Name: MAC Date/Time: 09/23/2021 12:23 PM  Performed by: Biagio Borg, CRNAPre-anesthesia Checklist: Patient identified, Emergency Drugs available, Suction available, Patient being monitored and Timeout performed Patient Re-evaluated:Patient Re-evaluated prior to induction Oxygen Delivery Method: Nasal cannula and Simple face mask Induction Type: IV induction Placement Confirmation: positive ETCO2 and CO2 detector

## 2021-09-23 NOTE — Op Note (Signed)
[facility] Gastroenterology Patient Name: Betty Peterson Procedure Date: 09/23/2021 11:24 AM MRN: [id number] Account #: [id number] Date of Birth: 1961/02/04 Admit Type: Outpatient Age: 61 Room: [facility] ENDO ROOM 1 Gender: Female Note Status: Finalized Instrument Name: Colonoscope [id number] Procedure:             Colonoscopy Indications:           Screening for colorectal malignant neoplasm Providers:             [name] DO, DO Referring MD:          Tracie Harrier, MD (Referring MD) Medicines:             Monitored Anesthesia Care Complications:         No immediate complications. Estimated blood loss: None. Procedure:             Pre-Anesthesia Assessment:                        - Prior to the procedure, a History and Physical was                         performed, and patient medications and allergies were                         reviewed. The patient is competent. The risks and                         benefits of the procedure and the sedation options and                         risks were discussed with the patient. All questions                         were answered and informed consent was obtained.                         Patient identification and proposed procedure were                         verified by the physician, the nurse, the anesthetist                         and the technician in the endoscopy suite. Mental                         Status Examination: alert and oriented. Airway                         Examination: normal oropharyngeal airway and neck                         mobility. Respiratory Examination: clear to                         auscultation. CV Examination: RRR, no murmurs, no S3                         or S4. Prophylactic Antibiotics: The patient does not  require prophylactic antibiotics. Prior                         Anticoagulants: The patient has taken no previous                          anticoagulant or antiplatelet agents. ASA Grade                         Assessment: III - A patient with severe systemic                         disease. After reviewing the risks and benefits, the                         patient was deemed in satisfactory condition to                         undergo the procedure. The anesthesia plan was to use                         monitored anesthesia care (MAC). Immediately prior to                         administration of medications, the patient was                         re-assessed for adequacy to receive sedatives. The                         heart rate, respiratory rate, oxygen saturations,                         blood pressure, adequacy of pulmonary ventilation, and                         response to care were monitored throughout the                         procedure. The physical status of the patient was                         re-assessed after the procedure.                        After obtaining informed consent, the colonoscope was                         passed under direct vision. Throughout the procedure,                         the patient's blood pressure, pulse, and oxygen                         saturations were monitored continuously. The                         Colonoscope was introduced through the anus and  advanced to the the cecum, identified by appendiceal                         orifice and ileocecal valve. The colonoscopy was                         somewhat difficult due to a redundant, dilated colon.                         Decreased abdominal tone. Fair amount of fluid/seeds.                         Successful completion of the procedure was aided by                         changing the patient to a prone position,                         straightening and shortening the scope to obtain bowel                         loop reduction, using scope torsion, applying                          abdominal pressure and lavage. The patient tolerated                         the procedure well. The quality of the bowel                         preparation was evaluated using the BBPS [facility] Bowel                         Preparation Scale) with scores of: Right Colon = 2                         (minor amount of residual staining, small fragments of                         stool and/or opaque liquid, but mucosa seen well),                         Transverse Colon = 2 (minor amount of residual                         staining, small fragments of stool and/or opaque                         liquid, but mucosa seen well) and Left Colon = 2                         (minor amount of residual staining, small fragments of                         stool and/or opaque liquid, but mucosa seen well). The  total BBPS score equals 6. The quality of the bowel                         preparation was good. Findings:      The perianal and digital rectal examinations were normal. Pertinent       negatives include normal sphincter tone.      The colon (entire examined portion) appeared normal. No abnormalities       noted in the areas of increased uptake on PET. Fluid was suctioned out       as best possible to increase visualization. Some areas were covered by       seeds which could not be suctioned. Cannot rule out small lesions under       these areas. Estimated blood loss: none.      The exam was otherwise without abnormality on direct and retroflexion       views. Impression:            - The entire examined colon is normal.                        - The examination was otherwise normal on direct and                         retroflexion views.                        - No specimens collected. Recommendation:        - Discharge patient to home.                        - Resume previous diet.                        - Continue present medications.                        - Repeat  colonoscopy in 10 years for screening                         purposes.                        - Return to referring physician as previously                         scheduled.                        - The findings and recommendations were discussed with                         the patient. Procedure Code(s):     --- Professional ---                        [phone], Colonoscopy, flexible; diagnostic, including                         collection of specimen(s) by brushing or washing, when                         performed (separate  procedure) Diagnosis Code(s):     --- Professional ---                        Z12.11, Encounter for screening for malignant neoplasm                         of colon CPT copyright 2019 [employer]. All rights reserved. The codes documented in this report are preliminary and upon coder review may  be revised to meet current compliance requirements. Attending Participation:      I personally performed the entire procedure. Volney American, DO [name] DO, DO 09/23/2021 1:33:39 PM This report has been signed electronically. Number of Addenda: 0 Note Initiated On: 09/23/2021 11:24 AM Scope Withdrawal Time: 0 hours 15 minutes 1 second  Total Procedure Duration: 0 hours 45 minutes 53 seconds  Estimated Blood Loss:  Estimated blood loss: none.      [facility]

## 2021-09-23 NOTE — Op Note (Signed)
[facility] Gastroenterology Patient Name: Betty Peterson Procedure Date: 09/23/2021 11:25 AM MRN: [id number] Account #: [id number] Date of Birth: 1961-01-18 Admit Type: Outpatient Age: 61 Room: [facility] ENDO ROOM 1 Gender: Female Note Status: Finalized Instrument Name: Upper Endoscope [id number] Procedure:             Upper GI endoscopy Indications:           Esophageal reflux Providers:             [name] DO, DO Referring MD:          Tracie Harrier, MD (Referring MD) Medicines:             Monitored Anesthesia Care Complications:         No immediate complications. Estimated blood loss: None. Procedure:             Pre-Anesthesia Assessment:                        - Prior to the procedure, a History and Physical was                         performed, and patient medications and allergies were                         reviewed. The patient is competent. The risks and                         benefits of the procedure and the sedation options and                         risks were discussed with the patient. All questions                         were answered and informed consent was obtained.                         Patient identification and proposed procedure were                         verified by the physician, the nurse, the anesthetist                         and the technician in the endoscopy suite. Mental                         Status Examination: alert and oriented. Airway                         Examination: normal oropharyngeal airway and neck                         mobility. Respiratory Examination: clear to                         auscultation. CV Examination: RRR, no murmurs, no S3                         or S4. Prophylactic Antibiotics: The patient does not  require prophylactic antibiotics. Prior                         Anticoagulants: The patient has taken no previous                         anticoagulant or  antiplatelet agents. ASA Grade                         Assessment: III - A patient with severe systemic                         disease. After reviewing the risks and benefits, the                         patient was deemed in satisfactory condition to                         undergo the procedure. The anesthesia plan was to use                         monitored anesthesia care (MAC). Immediately prior to                         administration of medications, the patient was                         re-assessed for adequacy to receive sedatives. The                         heart rate, respiratory rate, oxygen saturations,                         blood pressure, adequacy of pulmonary ventilation, and                         response to care were monitored throughout the                         procedure. The physical status of the patient was                         re-assessed after the procedure.                        After obtaining informed consent, the endoscope was                         passed under direct vision. Throughout the procedure,                         the patient's blood pressure, pulse, and oxygen                         saturations were monitored continuously. The Endoscope                         was introduced through the mouth, and advanced to the  second part of duodenum. The upper GI endoscopy was                         accomplished without difficulty. The patient tolerated                         the procedure well. Findings:      The duodenal bulb, first portion of the duodenum and second portion of       the duodenum were normal. Estimated blood loss: none.      A small amount of food (residue) was found on the greater curvature of       the stomach. Estimated blood loss: none.      Multiple less than 5 mm sessile polyps with no bleeding and no stigmata       of recent bleeding were found in the gastric fundus. Consistent with        previously documented fundic gland polyps Estimated blood loss: none.      The exam of the stomach was otherwise normal.      The Z-line was regular. Estimated blood loss: none.      Esophagogastric landmarks were identified: the gastroesophageal junction       was found at 36 cm from the incisors.      The exam of the esophagus was otherwise normal. Impression:            - Normal duodenal bulb, first portion of the duodenum                         and second portion of the duodenum.                        - A small amount of food (residue) in the stomach.                        - Multiple gastric polyps.                        - Z-line regular.                        - Esophagogastric landmarks identified.                        - No specimens collected. Recommendation:        - Discharge patient to home.                        - Resume previous diet.                        - Continue present medications.                        - Reduce protonix to 40 mg twice a day. Can continue                         to take 64m pepcid daily.                        No signs of reflux. likely regurgitation relating to  both gastroparesis and neruologic disease.                        - Return to GI clinic as previously scheduled.                        - The findings and recommendations were discussed with                         the patient.                        - proceed with colonoscopy Procedure Code(s):     --- Professional ---                        [phone], Esophagogastroduodenoscopy, flexible,                         transoral; diagnostic, including collection of                         specimen(s) by brushing or washing, when performed                         (separate procedure) Diagnosis Code(s):     --- Professional ---                        K31.7, Polyp of stomach and duodenum                        K21.9, Gastro-esophageal reflux disease without                          esophagitis CPT copyright 2019 American Medical Association. All rights reserved. The codes documented in this report are preliminary and upon coder review may  be revised to meet current compliance requirements. Attending Participation:      I personally performed the entire procedure. Volney American, DO [name] DO, DO 09/23/2021 12:31:50 PM This report has been signed electronically. Number of Addenda: 0 Note Initiated On: 09/23/2021 11:25 AM Estimated Blood Loss:  Estimated blood loss: none.      [facility]

## 2021-09-23 NOTE — Anesthesia Preprocedure Evaluation (Signed)
Anesthesia Evaluation  Patient identified by MRN, date of birth, ID band Patient awake    Reviewed: Allergy & Precautions, NPO status , Patient's Chart, lab work & pertinent test results  History of Anesthesia Complications (+) PONV and history of anesthetic complications  Airway Mallampati: III  TM Distance: <3 FB Neck ROM: full    Dental  (+) Chipped   Pulmonary asthma , sleep apnea , COPD,    Pulmonary exam normal        Cardiovascular hypertension, negative cardio ROS Normal cardiovascular exam     Neuro/Psych  Headaches,  Neuromuscular disease negative psych ROS   GI/Hepatic negative GI ROS, Neg liver ROS,   Endo/Other  diabetesHypothyroidism   Renal/GU negative Renal ROS  negative genitourinary   Musculoskeletal   Abdominal   Peds  Hematology negative hematology ROS (+)   Anesthesia Other Findings Past Medical History: No date: Anemia No date: Aortic aneurysm (HCC) No date: Arthritis No date: Asthma No date: Complication of anesthesia     Comment:  History of myasthenia gravis and mitochrondrial variant               followed by [facility] Neurology; reported hypotension after               sedation for CVL 04/2020 No date: COPD (chronic obstructive pulmonary disease) ([place]) No date: Diabetes mellitus without complication (HCC) No date: Fibromyalgia No date: Gastroparesis No date: Headache     Comment:  migraines No date: Hypertension No date: Hypothyroidism 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) No date: Myasthenia gravis ([place]) No date: Sleep apnea No date: Thyroid disease No date: Vitamin D deficiency  Past Surgical History: No date: ABDOMINAL HYSTERECTOMY No date: APPENDECTOMY No date: CARDIAC CATHETERIZATION No date: CHOLECYSTECTOMY No date: SPINE SURGERY No date: TONSILLECTOMY  BMI    Body Mass Index: 23.37 kg/m      Reproductive/Obstetrics negative OB ROS                              Anesthesia Physical Anesthesia Plan  ASA: 3  Anesthesia Plan: General   Post-op Pain Management:    Induction: Intravenous  PONV Risk Score and Plan: Propofol infusion and TIVA  Airway Management Planned: Natural Airway and Nasal Cannula  Additional Equipment:   Intra-op Plan:   Post-operative Plan:   Informed Consent: I have reviewed the patients History and Physical, chart, labs and discussed the procedure including the risks, benefits and alternatives for the proposed anesthesia with the patient or authorized representative who has indicated his/her understanding and acceptance.     Dental Advisory Given  Plan Discussed with: Anesthesiologist, CRNA and Surgeon  Anesthesia Plan Comments: (Patient consented for risks of anesthesia including but not limited to:  - adverse reactions to medications - risk of airway placement if required - damage to eyes, teeth, lips or other oral mucosa - nerve damage due to positioning  - sore throat or hoarseness - Damage to heart, brain, nerves, lungs, other parts of body or loss of life  Patient voiced understanding.)        Anesthesia Quick Evaluation

## 2021-09-23 NOTE — Interval H&P Note (Signed)
History and Physical Interval Note: Preprocedure H&P from 09/23/21  was reviewed and there was no interval change after seeing and examining the patient.  Written consent was obtained from the patient after discussion of risks, benefits, and alternatives. Patient has consented to proceed with Esophagogastroduodenoscopy and Colonoscopy with possible intervention   09/23/2021 12:15 PM  Virgina Deakins  has presented today for surgery, with the diagnosis of ABNORMAL pet OF COLON,WEIGHT LOSS,DYSPEPSIA,GERD,NONDIABETIC GASTROPARESIS.  The various methods of treatment have been discussed with the patient and family. After consideration of risks, benefits and other options for treatment, the patient has consented to  Procedure(s): COLONOSCOPY WITH PROPOFOL (N/A) ESOPHAGOGASTRODUODENOSCOPY (EGD) WITH PROPOFOL (N/A) as a surgical intervention.  The patient's history has been reviewed, patient examined, no change in status, stable for surgery.  I have reviewed the patient's chart and labs.  Questions were answered to the patient's satisfaction.     [name]

## 2021-09-26 ENCOUNTER — Encounter: Payer: Self-pay | Admitting: Gastroenterology

## 2021-09-27 ENCOUNTER — Other Ambulatory Visit: Payer: Self-pay | Admitting: Gastroenterology

## 2021-09-27 DIAGNOSIS — K3184 Gastroparesis: Secondary | ICD-10-CM

## 2021-09-27 DIAGNOSIS — R1013 Epigastric pain: Secondary | ICD-10-CM | POA: Diagnosis not present

## 2021-09-27 DIAGNOSIS — R634 Abnormal weight loss: Secondary | ICD-10-CM | POA: Diagnosis not present

## 2021-09-30 DIAGNOSIS — K3184 Gastroparesis: Secondary | ICD-10-CM | POA: Diagnosis not present

## 2021-09-30 DIAGNOSIS — R1013 Epigastric pain: Secondary | ICD-10-CM | POA: Diagnosis not present

## 2021-10-18 DIAGNOSIS — D2261 Melanocytic nevi of right upper limb, including shoulder: Secondary | ICD-10-CM | POA: Diagnosis not present

## 2021-10-18 DIAGNOSIS — L648 Other androgenic alopecia: Secondary | ICD-10-CM | POA: Diagnosis not present

## 2021-10-18 DIAGNOSIS — L219 Seborrheic dermatitis, unspecified: Secondary | ICD-10-CM | POA: Diagnosis not present

## 2021-10-19 DIAGNOSIS — M797 Fibromyalgia: Secondary | ICD-10-CM | POA: Diagnosis not present

## 2021-10-19 DIAGNOSIS — I10 Essential (primary) hypertension: Secondary | ICD-10-CM | POA: Diagnosis not present

## 2021-10-19 DIAGNOSIS — R202 Paresthesia of skin: Secondary | ICD-10-CM | POA: Diagnosis not present

## 2021-10-19 DIAGNOSIS — E039 Hypothyroidism, unspecified: Secondary | ICD-10-CM | POA: Diagnosis not present

## 2021-10-19 DIAGNOSIS — N390 Urinary tract infection, site not specified: Secondary | ICD-10-CM | POA: Diagnosis not present

## 2021-10-19 DIAGNOSIS — G43019 Migraine without aura, intractable, without status migrainosus: Secondary | ICD-10-CM | POA: Diagnosis not present

## 2021-10-19 DIAGNOSIS — E8840 Mitochondrial metabolism disorder, unspecified: Secondary | ICD-10-CM | POA: Diagnosis not present

## 2021-10-19 DIAGNOSIS — E1165 Type 2 diabetes mellitus with hyperglycemia: Secondary | ICD-10-CM | POA: Diagnosis not present

## 2021-10-19 DIAGNOSIS — G2581 Restless legs syndrome: Secondary | ICD-10-CM | POA: Diagnosis not present

## 2021-10-21 ENCOUNTER — Ambulatory Visit
Admission: RE | Admit: 2021-10-21 | Discharge: 2021-10-21 | Disposition: A | Discharge status: Home / Self Care | Payer: Medicare HMO | Source: Ambulatory Visit | Attending: Gastroenterology | Admitting: Gastroenterology

## 2021-10-21 DIAGNOSIS — Z0389 Encounter for observation for other suspected diseases and conditions ruled out: Secondary | ICD-10-CM | POA: Diagnosis not present

## 2021-10-21 DIAGNOSIS — K3184 Gastroparesis: Secondary | ICD-10-CM | POA: Diagnosis not present

## 2021-10-21 MED ORDER — TECHNETIUM TC 99M SULFUR COLLOID: 2.0000 | Freq: Once | INTRAVENOUS | Status: DC | PRN

## 2021-10-21 MED ORDER — TECHNETIUM TC 99M SULFUR COLLOID
2.3100 | Freq: Once | INTRAVENOUS | Status: AC | PRN
Start: 2021-10-21 — End: 2021-10-21
  Administered 2021-10-21: 2.31 via ORAL

## 2021-10-26 DIAGNOSIS — G43909 Migraine, unspecified, not intractable, without status migrainosus: Secondary | ICD-10-CM | POA: Diagnosis not present

## 2021-10-26 DIAGNOSIS — Z0000 Encounter for general adult medical examination without abnormal findings: Secondary | ICD-10-CM | POA: Diagnosis not present

## 2021-10-26 DIAGNOSIS — N289 Disorder of kidney and ureter, unspecified: Secondary | ICD-10-CM | POA: Diagnosis not present

## 2021-10-26 DIAGNOSIS — M797 Fibromyalgia: Secondary | ICD-10-CM | POA: Diagnosis not present

## 2021-10-26 DIAGNOSIS — E039 Hypothyroidism, unspecified: Secondary | ICD-10-CM | POA: Diagnosis not present

## 2021-10-26 DIAGNOSIS — R600 Localized edema: Secondary | ICD-10-CM | POA: Diagnosis not present

## 2021-10-26 DIAGNOSIS — E1143 Type 2 diabetes mellitus with diabetic autonomic (poly)neuropathy: Secondary | ICD-10-CM | POA: Diagnosis not present

## 2021-10-26 DIAGNOSIS — D649 Anemia, unspecified: Secondary | ICD-10-CM | POA: Diagnosis not present

## 2021-10-26 DIAGNOSIS — I10 Essential (primary) hypertension: Secondary | ICD-10-CM | POA: Diagnosis not present

## 2021-10-28 DIAGNOSIS — I10 Essential (primary) hypertension: Secondary | ICD-10-CM | POA: Diagnosis not present

## 2021-10-28 DIAGNOSIS — I7121 Aneurysm of the ascending aorta, without rupture: Secondary | ICD-10-CM | POA: Diagnosis not present

## 2021-11-28 DIAGNOSIS — G713 Mitochondrial myopathy, not elsewhere classified: Secondary | ICD-10-CM | POA: Diagnosis not present

## 2021-12-13 ENCOUNTER — Other Ambulatory Visit: Payer: Self-pay | Admitting: Gastroenterology

## 2021-12-13 ENCOUNTER — Ambulatory Visit
Admission: RE | Admit: 2021-12-13 | Discharge: 2021-12-13 | Disposition: A | Discharge status: Home / Self Care | Payer: Medicare PPO | Source: Ambulatory Visit | Attending: Gastroenterology | Admitting: Gastroenterology

## 2021-12-13 DIAGNOSIS — R1013 Epigastric pain: Secondary | ICD-10-CM | POA: Diagnosis present

## 2021-12-13 DIAGNOSIS — R1084 Generalized abdominal pain: Secondary | ICD-10-CM

## 2022-02-09 DIAGNOSIS — E039 Hypothyroidism, unspecified: Secondary | ICD-10-CM | POA: Diagnosis not present

## 2022-02-09 DIAGNOSIS — I10 Essential (primary) hypertension: Secondary | ICD-10-CM | POA: Diagnosis not present

## 2022-02-09 DIAGNOSIS — R8290 Unspecified abnormal findings in urine: Secondary | ICD-10-CM | POA: Diagnosis not present

## 2022-02-09 DIAGNOSIS — Z0000 Encounter for general adult medical examination without abnormal findings: Secondary | ICD-10-CM | POA: Diagnosis not present

## 2022-02-13 DIAGNOSIS — G4733 Obstructive sleep apnea (adult) (pediatric): Secondary | ICD-10-CM | POA: Diagnosis not present

## 2022-02-13 DIAGNOSIS — E1143 Type 2 diabetes mellitus with diabetic autonomic (poly)neuropathy: Secondary | ICD-10-CM | POA: Diagnosis not present

## 2022-02-13 DIAGNOSIS — I10 Essential (primary) hypertension: Secondary | ICD-10-CM | POA: Diagnosis not present

## 2022-02-13 DIAGNOSIS — R600 Localized edema: Secondary | ICD-10-CM | POA: Diagnosis not present

## 2022-02-13 DIAGNOSIS — E039 Hypothyroidism, unspecified: Secondary | ICD-10-CM | POA: Diagnosis not present

## 2022-02-13 DIAGNOSIS — N289 Disorder of kidney and ureter, unspecified: Secondary | ICD-10-CM | POA: Diagnosis not present

## 2022-02-13 DIAGNOSIS — Z23 Encounter for immunization: Secondary | ICD-10-CM | POA: Diagnosis not present

## 2022-02-13 DIAGNOSIS — G43909 Migraine, unspecified, not intractable, without status migrainosus: Secondary | ICD-10-CM | POA: Diagnosis not present

## 2022-02-17 IMAGING — MG DIGITAL SCREENING BILAT W/ TOMO W/ CAD
6 of 12 series · 6 of 36 positions shown · non-contrast
Comparison: Previous exam(s).

CLINICAL DATA: Screening.

EXAM:
DIGITAL SCREENING BILATERAL MAMMOGRAM WITH TOMO AND CAD

[L CC synth-2D]
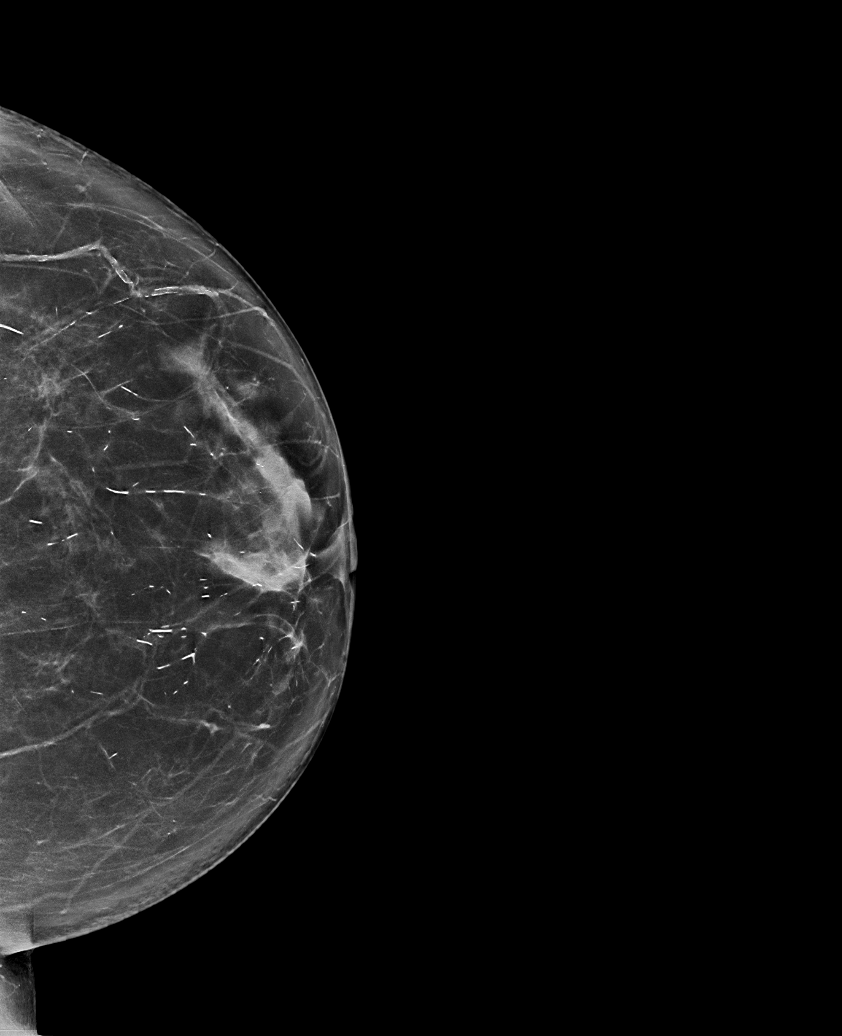

[R MLO synth-2D (1 of 2)]
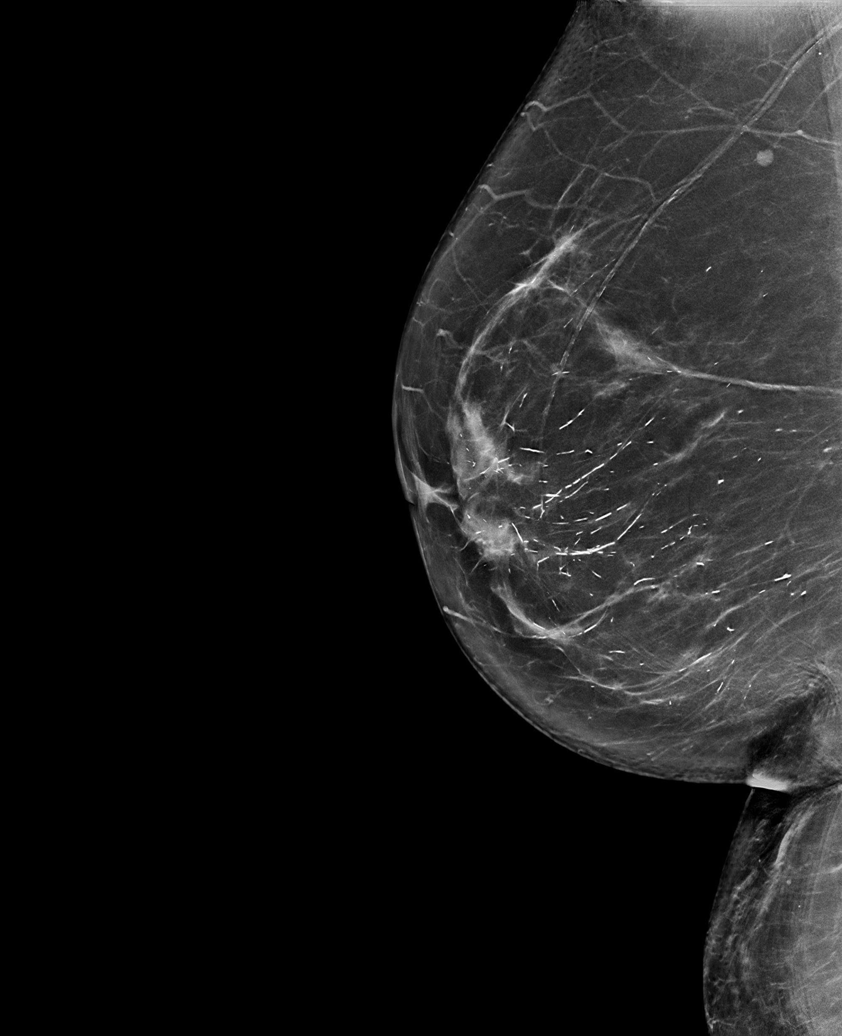

[R CC synth-2D]
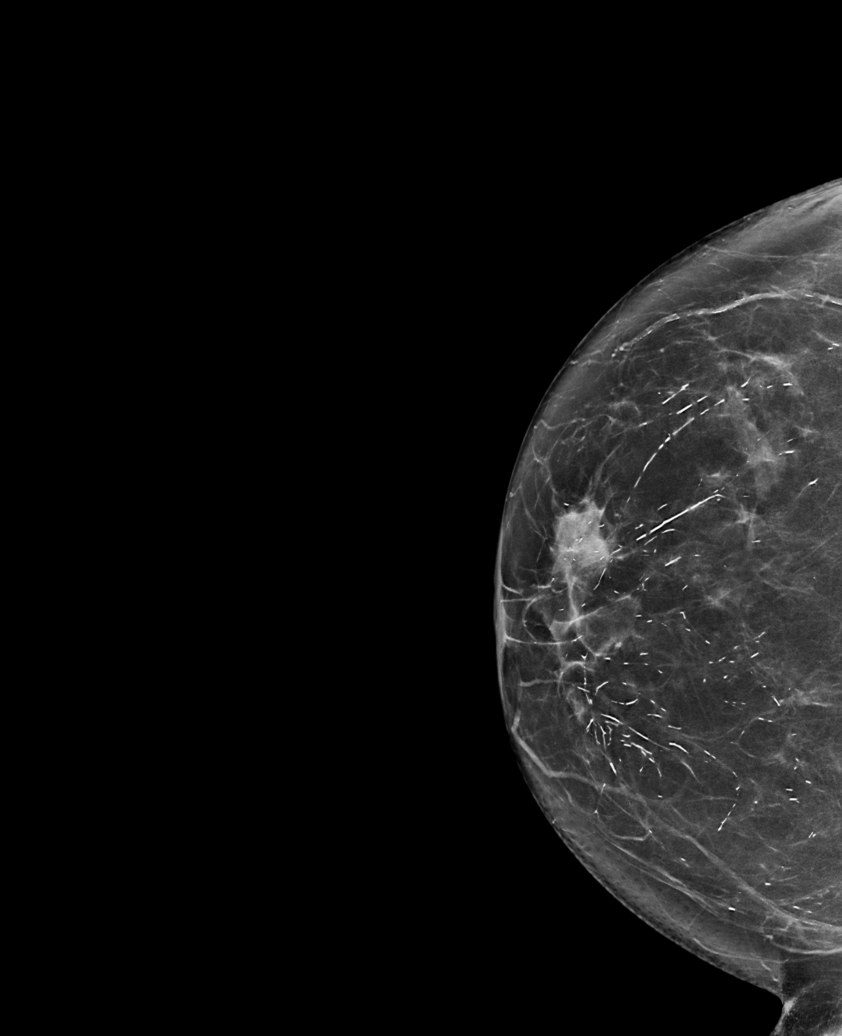

[L MLO synth-2D (1 of 2)]
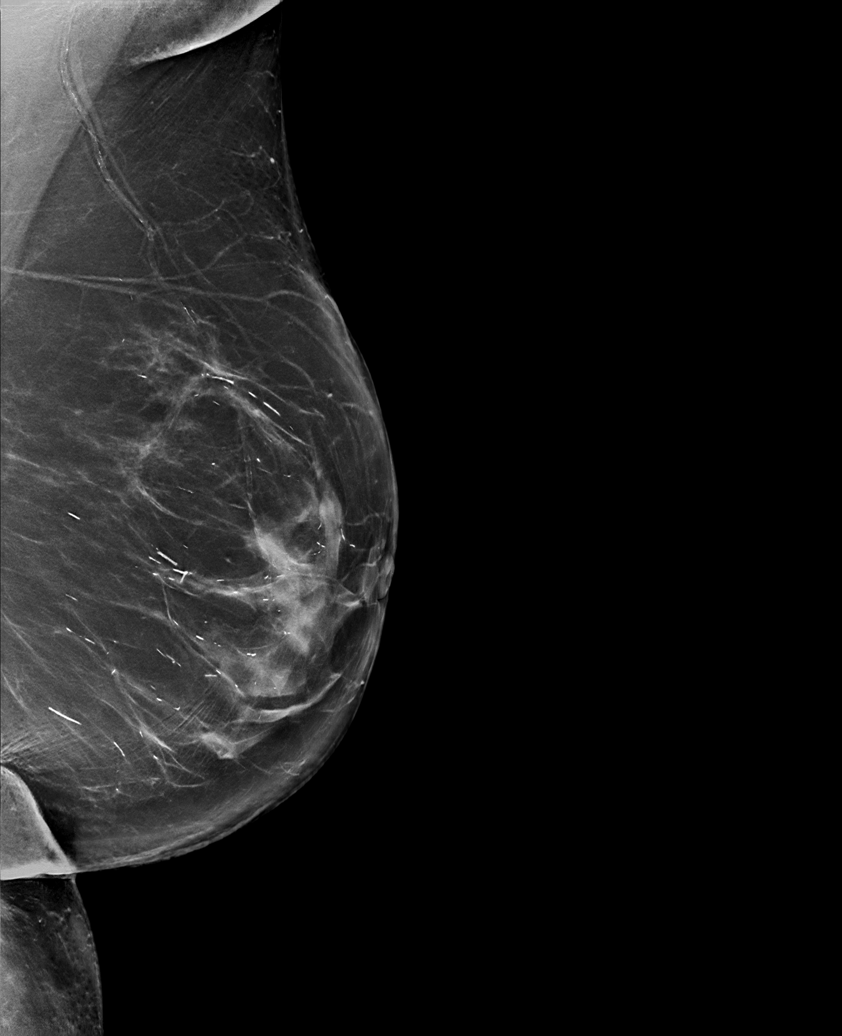

[L MLO synth-2D (2 of 2)]
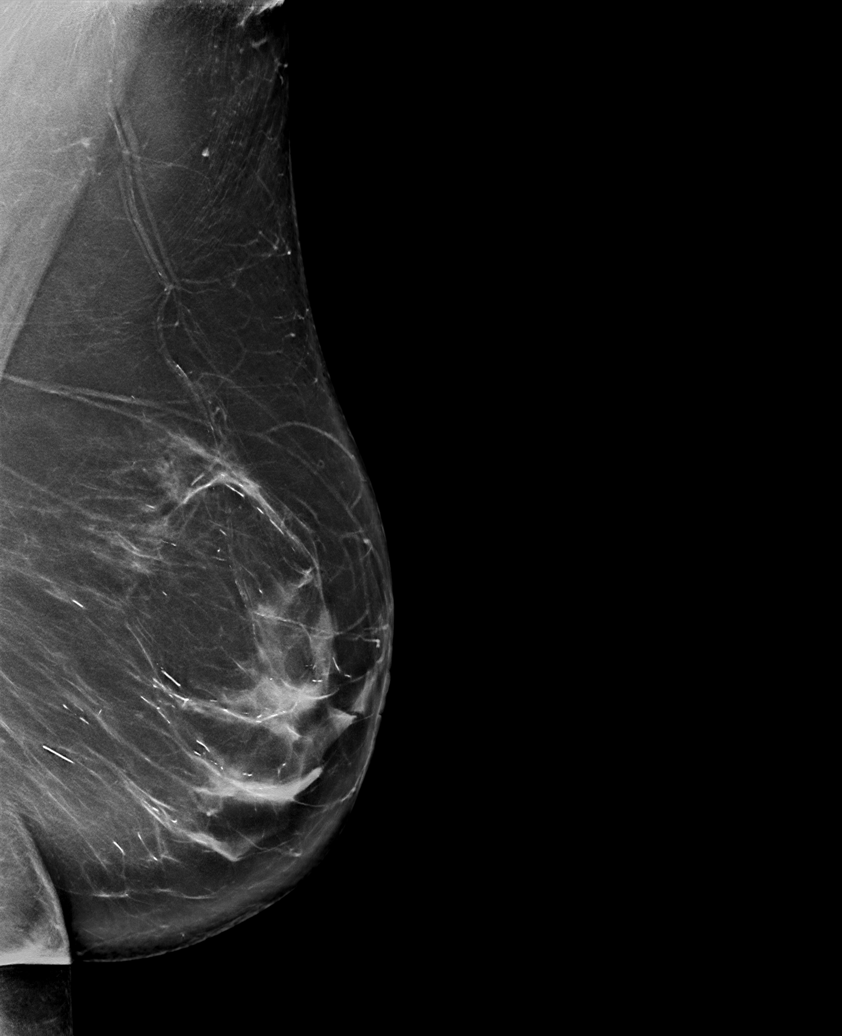

[R MLO synth-2D (2 of 2)]
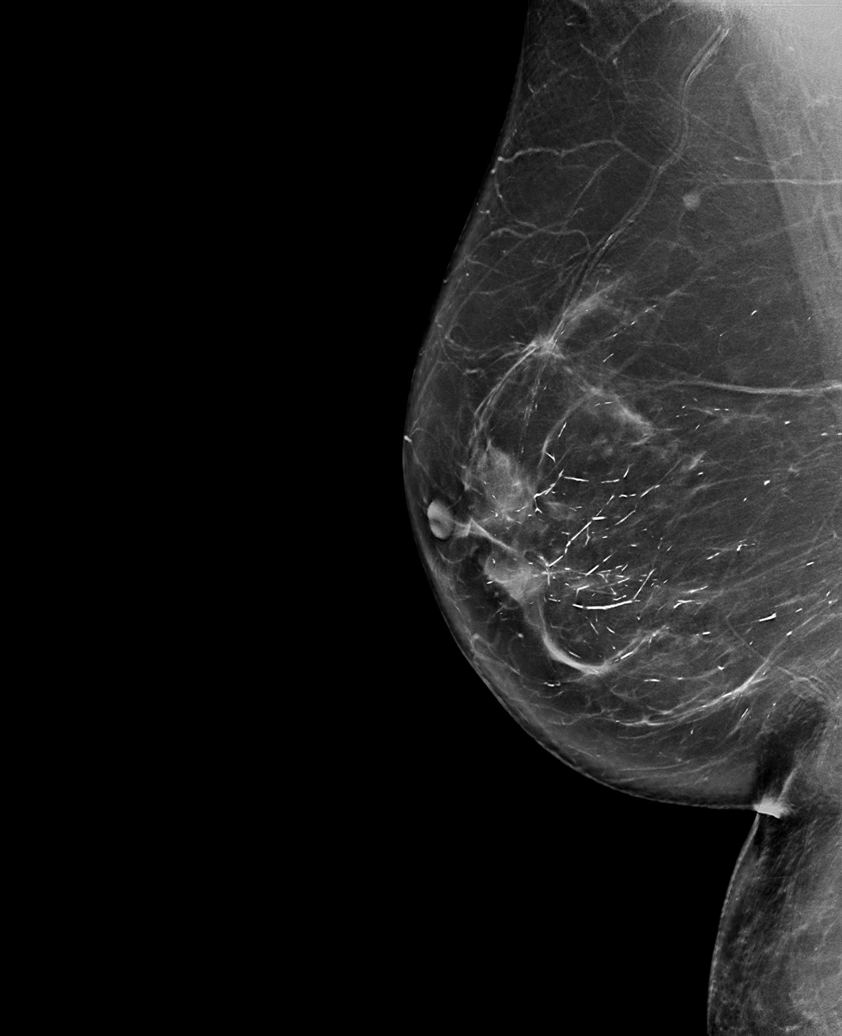

[6 of 36 positions shown; findings below may reference images not displayed]

ACR Breast Density Category b: There are scattered areas of
fibroglandular density.
FINDINGS: There are no findings suspicious for malignancy. Images were
processed with CAD.
IMPRESSION: No mammographic evidence of malignancy. A result letter of this
screening mammogram will be mailed directly to the patient.

RECOMMENDATION:
Screening mammogram in one year. (Code:[id number])

BI-RADS CATEGORY  1: Negative.

## 2022-02-23 DIAGNOSIS — R110 Nausea: Secondary | ICD-10-CM | POA: Diagnosis not present

## 2022-02-23 DIAGNOSIS — E876 Hypokalemia: Secondary | ICD-10-CM | POA: Diagnosis not present

## 2022-02-24 DIAGNOSIS — E039 Hypothyroidism, unspecified: Secondary | ICD-10-CM | POA: Diagnosis not present

## 2022-02-24 DIAGNOSIS — E1143 Type 2 diabetes mellitus with diabetic autonomic (poly)neuropathy: Secondary | ICD-10-CM | POA: Diagnosis not present

## 2022-02-24 DIAGNOSIS — G713 Mitochondrial myopathy, not elsewhere classified: Secondary | ICD-10-CM | POA: Diagnosis not present

## 2022-02-24 DIAGNOSIS — K3184 Gastroparesis: Secondary | ICD-10-CM | POA: Diagnosis not present

## 2022-02-24 DIAGNOSIS — I719 Aortic aneurysm of unspecified site, without rupture: Secondary | ICD-10-CM | POA: Diagnosis not present

## 2022-02-24 DIAGNOSIS — K219 Gastro-esophageal reflux disease without esophagitis: Secondary | ICD-10-CM | POA: Diagnosis not present

## 2022-02-24 DIAGNOSIS — N179 Acute kidney failure, unspecified: Secondary | ICD-10-CM | POA: Diagnosis not present

## 2022-02-24 DIAGNOSIS — E876 Hypokalemia: Secondary | ICD-10-CM | POA: Diagnosis not present

## 2022-02-24 DIAGNOSIS — K589 Irritable bowel syndrome without diarrhea: Secondary | ICD-10-CM | POA: Diagnosis not present

## 2022-03-06 DIAGNOSIS — G713 Mitochondrial myopathy, not elsewhere classified: Secondary | ICD-10-CM | POA: Diagnosis not present

## 2022-03-06 DIAGNOSIS — N1832 Chronic kidney disease, stage 3b: Secondary | ICD-10-CM | POA: Diagnosis not present

## 2022-03-06 DIAGNOSIS — Z09 Encounter for follow-up examination after completed treatment for conditions other than malignant neoplasm: Secondary | ICD-10-CM | POA: Diagnosis not present

## 2022-03-06 DIAGNOSIS — E876 Hypokalemia: Secondary | ICD-10-CM | POA: Diagnosis not present

## 2022-03-13 DIAGNOSIS — Z0100 Encounter for examination of eyes and vision without abnormal findings: Secondary | ICD-10-CM | POA: Diagnosis not present

## 2022-03-13 DIAGNOSIS — H2513 Age-related nuclear cataract, bilateral: Secondary | ICD-10-CM | POA: Diagnosis not present

## 2022-03-21 DIAGNOSIS — H524 Presbyopia: Secondary | ICD-10-CM | POA: Diagnosis not present

## 2022-03-21 DIAGNOSIS — H52209 Unspecified astigmatism, unspecified eye: Secondary | ICD-10-CM | POA: Diagnosis not present

## 2022-03-21 DIAGNOSIS — H5213 Myopia, bilateral: Secondary | ICD-10-CM | POA: Diagnosis not present

## 2022-03-27 DIAGNOSIS — M7582 Other shoulder lesions, left shoulder: Secondary | ICD-10-CM | POA: Diagnosis not present

## 2022-03-27 DIAGNOSIS — M19012 Primary osteoarthritis, left shoulder: Secondary | ICD-10-CM | POA: Diagnosis not present

## 2022-03-27 DIAGNOSIS — N1832 Chronic kidney disease, stage 3b: Secondary | ICD-10-CM | POA: Diagnosis not present

## 2022-03-27 DIAGNOSIS — E876 Hypokalemia: Secondary | ICD-10-CM | POA: Diagnosis not present

## 2022-03-27 DIAGNOSIS — J069 Acute upper respiratory infection, unspecified: Secondary | ICD-10-CM | POA: Diagnosis not present

## 2022-03-30 DIAGNOSIS — S46012A Strain of muscle(s) and tendon(s) of the rotator cuff of left shoulder, initial encounter: Secondary | ICD-10-CM | POA: Diagnosis not present

## 2022-04-12 DIAGNOSIS — M7582 Other shoulder lesions, left shoulder: Secondary | ICD-10-CM | POA: Diagnosis not present

## 2022-04-12 DIAGNOSIS — M6281 Muscle weakness (generalized): Secondary | ICD-10-CM | POA: Diagnosis not present

## 2022-04-12 DIAGNOSIS — R293 Abnormal posture: Secondary | ICD-10-CM | POA: Diagnosis not present

## 2022-04-12 DIAGNOSIS — M25512 Pain in left shoulder: Secondary | ICD-10-CM | POA: Diagnosis not present

## 2022-04-12 DIAGNOSIS — S46092D Other injury of muscle(s) and tendon(s) of the rotator cuff of left shoulder, subsequent encounter: Secondary | ICD-10-CM | POA: Diagnosis not present

## 2022-04-24 DIAGNOSIS — M7582 Other shoulder lesions, left shoulder: Secondary | ICD-10-CM | POA: Diagnosis not present

## 2022-04-24 DIAGNOSIS — M25512 Pain in left shoulder: Secondary | ICD-10-CM | POA: Diagnosis not present

## 2022-04-24 DIAGNOSIS — M6281 Muscle weakness (generalized): Secondary | ICD-10-CM | POA: Diagnosis not present

## 2022-04-24 DIAGNOSIS — S46092D Other injury of muscle(s) and tendon(s) of the rotator cuff of left shoulder, subsequent encounter: Secondary | ICD-10-CM | POA: Diagnosis not present

## 2022-04-24 DIAGNOSIS — R293 Abnormal posture: Secondary | ICD-10-CM | POA: Diagnosis not present

## 2022-04-25 DIAGNOSIS — G43019 Migraine without aura, intractable, without status migrainosus: Secondary | ICD-10-CM | POA: Diagnosis not present

## 2022-04-25 DIAGNOSIS — M797 Fibromyalgia: Secondary | ICD-10-CM | POA: Diagnosis not present

## 2022-04-25 DIAGNOSIS — H9193 Unspecified hearing loss, bilateral: Secondary | ICD-10-CM | POA: Diagnosis not present

## 2022-04-25 DIAGNOSIS — G2581 Restless legs syndrome: Secondary | ICD-10-CM | POA: Diagnosis not present

## 2022-04-25 DIAGNOSIS — I959 Hypotension, unspecified: Secondary | ICD-10-CM | POA: Diagnosis not present

## 2022-04-25 DIAGNOSIS — E8840 Mitochondrial metabolism disorder, unspecified: Secondary | ICD-10-CM | POA: Diagnosis not present

## 2022-04-25 DIAGNOSIS — G7001 Myasthenia gravis with (acute) exacerbation: Secondary | ICD-10-CM | POA: Diagnosis not present

## 2022-04-25 DIAGNOSIS — E876 Hypokalemia: Secondary | ICD-10-CM | POA: Diagnosis not present

## 2022-05-03 DIAGNOSIS — W182XXA Fall in (into) shower or empty bathtub, initial encounter: Secondary | ICD-10-CM | POA: Diagnosis not present

## 2022-05-03 DIAGNOSIS — S46012A Strain of muscle(s) and tendon(s) of the rotator cuff of left shoulder, initial encounter: Secondary | ICD-10-CM | POA: Diagnosis not present

## 2022-05-04 DIAGNOSIS — R1084 Generalized abdominal pain: Secondary | ICD-10-CM | POA: Diagnosis not present

## 2022-05-04 DIAGNOSIS — E876 Hypokalemia: Secondary | ICD-10-CM | POA: Diagnosis not present

## 2022-05-05 DIAGNOSIS — G4733 Obstructive sleep apnea (adult) (pediatric): Secondary | ICD-10-CM | POA: Diagnosis not present

## 2022-05-06 DIAGNOSIS — R339 Retention of urine, unspecified: Secondary | ICD-10-CM | POA: Diagnosis not present

## 2022-05-06 DIAGNOSIS — R112 Nausea with vomiting, unspecified: Secondary | ICD-10-CM | POA: Diagnosis not present

## 2022-05-06 DIAGNOSIS — R1084 Generalized abdominal pain: Secondary | ICD-10-CM | POA: Diagnosis not present

## 2022-05-06 DIAGNOSIS — R531 Weakness: Secondary | ICD-10-CM | POA: Diagnosis not present

## 2022-05-06 DIAGNOSIS — K6389 Other specified diseases of intestine: Secondary | ICD-10-CM | POA: Diagnosis not present

## 2022-05-06 DIAGNOSIS — N390 Urinary tract infection, site not specified: Secondary | ICD-10-CM | POA: Diagnosis not present

## 2022-05-06 DIAGNOSIS — N179 Acute kidney failure, unspecified: Secondary | ICD-10-CM | POA: Diagnosis not present

## 2022-05-06 DIAGNOSIS — R5383 Other fatigue: Secondary | ICD-10-CM | POA: Diagnosis not present

## 2022-05-06 DIAGNOSIS — G713 Mitochondrial myopathy, not elsewhere classified: Secondary | ICD-10-CM | POA: Diagnosis not present

## 2022-05-06 DIAGNOSIS — R42 Dizziness and giddiness: Secondary | ICD-10-CM | POA: Diagnosis not present

## 2022-05-07 DIAGNOSIS — K6389 Other specified diseases of intestine: Secondary | ICD-10-CM | POA: Diagnosis not present

## 2022-05-07 DIAGNOSIS — G713 Mitochondrial myopathy, not elsewhere classified: Secondary | ICD-10-CM | POA: Diagnosis not present

## 2022-05-11 DIAGNOSIS — M797 Fibromyalgia: Secondary | ICD-10-CM | POA: Diagnosis not present

## 2022-05-11 DIAGNOSIS — G4733 Obstructive sleep apnea (adult) (pediatric): Secondary | ICD-10-CM | POA: Diagnosis not present

## 2022-05-11 DIAGNOSIS — I10 Essential (primary) hypertension: Secondary | ICD-10-CM | POA: Diagnosis not present

## 2022-05-11 DIAGNOSIS — I7121 Aneurysm of the ascending aorta, without rupture: Secondary | ICD-10-CM | POA: Diagnosis not present

## 2022-05-11 DIAGNOSIS — R110 Nausea: Secondary | ICD-10-CM | POA: Diagnosis not present

## 2022-05-11 DIAGNOSIS — M25812 Other specified joint disorders, left shoulder: Secondary | ICD-10-CM | POA: Diagnosis not present

## 2022-05-11 DIAGNOSIS — R1084 Generalized abdominal pain: Secondary | ICD-10-CM | POA: Diagnosis not present

## 2022-05-11 DIAGNOSIS — E1143 Type 2 diabetes mellitus with diabetic autonomic (poly)neuropathy: Secondary | ICD-10-CM | POA: Diagnosis not present

## 2022-05-11 DIAGNOSIS — R197 Diarrhea, unspecified: Secondary | ICD-10-CM | POA: Diagnosis not present

## 2022-05-11 DIAGNOSIS — Z87898 Personal history of other specified conditions: Secondary | ICD-10-CM | POA: Diagnosis not present

## 2022-05-11 DIAGNOSIS — E876 Hypokalemia: Secondary | ICD-10-CM | POA: Diagnosis not present

## 2022-05-12 DIAGNOSIS — G713 Mitochondrial myopathy, not elsewhere classified: Secondary | ICD-10-CM | POA: Diagnosis not present

## 2022-05-12 DIAGNOSIS — R9431 Abnormal electrocardiogram [ECG] [EKG]: Secondary | ICD-10-CM | POA: Diagnosis not present

## 2022-05-12 DIAGNOSIS — I10 Essential (primary) hypertension: Secondary | ICD-10-CM | POA: Diagnosis not present

## 2022-05-12 DIAGNOSIS — I7121 Aneurysm of the ascending aorta, without rupture: Secondary | ICD-10-CM | POA: Diagnosis not present

## 2022-05-22 DIAGNOSIS — E876 Hypokalemia: Secondary | ICD-10-CM | POA: Diagnosis not present

## 2022-05-25 DIAGNOSIS — Z6827 Body mass index (BMI) 27.0-27.9, adult: Secondary | ICD-10-CM | POA: Diagnosis not present

## 2022-05-25 DIAGNOSIS — G713 Mitochondrial myopathy, not elsewhere classified: Secondary | ICD-10-CM | POA: Diagnosis not present

## 2022-05-25 DIAGNOSIS — G4733 Obstructive sleep apnea (adult) (pediatric): Secondary | ICD-10-CM | POA: Diagnosis not present

## 2022-05-26 DIAGNOSIS — G4733 Obstructive sleep apnea (adult) (pediatric): Secondary | ICD-10-CM | POA: Diagnosis not present

## 2022-05-30 DIAGNOSIS — I129 Hypertensive chronic kidney disease with stage 1 through stage 4 chronic kidney disease, or unspecified chronic kidney disease: Secondary | ICD-10-CM | POA: Diagnosis not present

## 2022-05-30 DIAGNOSIS — J449 Chronic obstructive pulmonary disease, unspecified: Secondary | ICD-10-CM | POA: Diagnosis not present

## 2022-05-30 DIAGNOSIS — K3184 Gastroparesis: Secondary | ICD-10-CM | POA: Diagnosis not present

## 2022-05-30 DIAGNOSIS — R9431 Abnormal electrocardiogram [ECG] [EKG]: Secondary | ICD-10-CM | POA: Diagnosis not present

## 2022-05-30 DIAGNOSIS — E039 Hypothyroidism, unspecified: Secondary | ICD-10-CM | POA: Diagnosis not present

## 2022-05-30 DIAGNOSIS — N183 Chronic kidney disease, stage 3 unspecified: Secondary | ICD-10-CM | POA: Diagnosis not present

## 2022-05-30 DIAGNOSIS — G4733 Obstructive sleep apnea (adult) (pediatric): Secondary | ICD-10-CM | POA: Diagnosis not present

## 2022-05-30 DIAGNOSIS — Z79899 Other long term (current) drug therapy: Secondary | ICD-10-CM | POA: Diagnosis not present

## 2022-05-30 DIAGNOSIS — G713 Mitochondrial myopathy, not elsewhere classified: Secondary | ICD-10-CM | POA: Diagnosis not present

## 2022-06-09 DIAGNOSIS — Z79899 Other long term (current) drug therapy: Secondary | ICD-10-CM | POA: Diagnosis not present

## 2022-06-09 DIAGNOSIS — G4733 Obstructive sleep apnea (adult) (pediatric): Secondary | ICD-10-CM | POA: Diagnosis not present

## 2022-06-09 DIAGNOSIS — G4700 Insomnia, unspecified: Secondary | ICD-10-CM | POA: Diagnosis not present

## 2022-06-09 DIAGNOSIS — I129 Hypertensive chronic kidney disease with stage 1 through stage 4 chronic kidney disease, or unspecified chronic kidney disease: Secondary | ICD-10-CM | POA: Diagnosis not present

## 2022-06-09 DIAGNOSIS — M797 Fibromyalgia: Secondary | ICD-10-CM | POA: Diagnosis not present

## 2022-06-09 DIAGNOSIS — K1329 Other disturbances of oral epithelium, including tongue: Secondary | ICD-10-CM | POA: Diagnosis not present

## 2022-06-09 DIAGNOSIS — N189 Chronic kidney disease, unspecified: Secondary | ICD-10-CM | POA: Diagnosis not present

## 2022-06-09 DIAGNOSIS — K3184 Gastroparesis: Secondary | ICD-10-CM | POA: Diagnosis not present

## 2022-06-09 DIAGNOSIS — N183 Chronic kidney disease, stage 3 unspecified: Secondary | ICD-10-CM | POA: Diagnosis not present

## 2022-06-09 DIAGNOSIS — E8840 Mitochondrial metabolism disorder, unspecified: Secondary | ICD-10-CM | POA: Diagnosis not present

## 2022-06-09 DIAGNOSIS — J351 Hypertrophy of tonsils: Secondary | ICD-10-CM | POA: Diagnosis not present

## 2022-06-09 DIAGNOSIS — Z7722 Contact with and (suspected) exposure to environmental tobacco smoke (acute) (chronic): Secondary | ICD-10-CM | POA: Diagnosis not present

## 2022-06-09 DIAGNOSIS — R0683 Snoring: Secondary | ICD-10-CM | POA: Diagnosis not present

## 2022-06-12 DIAGNOSIS — E039 Hypothyroidism, unspecified: Secondary | ICD-10-CM | POA: Diagnosis not present

## 2022-06-12 DIAGNOSIS — N1832 Chronic kidney disease, stage 3b: Secondary | ICD-10-CM | POA: Diagnosis not present

## 2022-06-12 DIAGNOSIS — R399 Unspecified symptoms and signs involving the genitourinary system: Secondary | ICD-10-CM | POA: Diagnosis not present

## 2022-06-12 DIAGNOSIS — G4733 Obstructive sleep apnea (adult) (pediatric): Secondary | ICD-10-CM | POA: Diagnosis not present

## 2022-06-12 DIAGNOSIS — I10 Essential (primary) hypertension: Secondary | ICD-10-CM | POA: Diagnosis not present

## 2022-06-12 DIAGNOSIS — G713 Mitochondrial myopathy, not elsewhere classified: Secondary | ICD-10-CM | POA: Diagnosis not present

## 2022-06-12 DIAGNOSIS — R600 Localized edema: Secondary | ICD-10-CM | POA: Diagnosis not present

## 2022-06-16 DIAGNOSIS — G4733 Obstructive sleep apnea (adult) (pediatric): Secondary | ICD-10-CM | POA: Diagnosis not present

## 2022-06-21 DIAGNOSIS — E039 Hypothyroidism, unspecified: Secondary | ICD-10-CM | POA: Diagnosis not present

## 2022-06-21 DIAGNOSIS — G4733 Obstructive sleep apnea (adult) (pediatric): Secondary | ICD-10-CM | POA: Diagnosis not present

## 2022-06-21 DIAGNOSIS — Z0000 Encounter for general adult medical examination without abnormal findings: Secondary | ICD-10-CM | POA: Diagnosis not present

## 2022-06-21 DIAGNOSIS — Z23 Encounter for immunization: Secondary | ICD-10-CM | POA: Diagnosis not present

## 2022-06-21 DIAGNOSIS — I10 Essential (primary) hypertension: Secondary | ICD-10-CM | POA: Diagnosis not present

## 2022-06-21 DIAGNOSIS — G713 Mitochondrial myopathy, not elsewhere classified: Secondary | ICD-10-CM | POA: Diagnosis not present

## 2022-06-21 DIAGNOSIS — G43909 Migraine, unspecified, not intractable, without status migrainosus: Secondary | ICD-10-CM | POA: Diagnosis not present

## 2022-06-21 DIAGNOSIS — M797 Fibromyalgia: Secondary | ICD-10-CM | POA: Diagnosis not present

## 2022-06-21 DIAGNOSIS — N289 Disorder of kidney and ureter, unspecified: Secondary | ICD-10-CM | POA: Diagnosis not present

## 2022-06-27 DIAGNOSIS — N1832 Chronic kidney disease, stage 3b: Secondary | ICD-10-CM | POA: Diagnosis not present

## 2022-06-29 DIAGNOSIS — I10 Essential (primary) hypertension: Secondary | ICD-10-CM | POA: Diagnosis not present

## 2022-06-29 DIAGNOSIS — J449 Chronic obstructive pulmonary disease, unspecified: Secondary | ICD-10-CM | POA: Diagnosis not present

## 2022-06-29 DIAGNOSIS — G4733 Obstructive sleep apnea (adult) (pediatric): Secondary | ICD-10-CM | POA: Diagnosis not present

## 2022-06-29 DIAGNOSIS — G713 Mitochondrial myopathy, not elsewhere classified: Secondary | ICD-10-CM | POA: Diagnosis not present

## 2022-06-29 DIAGNOSIS — R7303 Prediabetes: Secondary | ICD-10-CM | POA: Diagnosis not present

## 2022-07-07 DIAGNOSIS — G713 Mitochondrial myopathy, not elsewhere classified: Secondary | ICD-10-CM | POA: Diagnosis not present

## 2022-07-07 DIAGNOSIS — Z9181 History of falling: Secondary | ICD-10-CM | POA: Diagnosis not present

## 2022-07-07 DIAGNOSIS — M6281 Muscle weakness (generalized): Secondary | ICD-10-CM | POA: Diagnosis not present

## 2022-07-07 DIAGNOSIS — R2689 Other abnormalities of gait and mobility: Secondary | ICD-10-CM | POA: Diagnosis not present

## 2022-07-21 DIAGNOSIS — Z5181 Encounter for therapeutic drug level monitoring: Secondary | ICD-10-CM | POA: Diagnosis not present

## 2022-07-21 DIAGNOSIS — N189 Chronic kidney disease, unspecified: Secondary | ICD-10-CM | POA: Diagnosis not present

## 2022-07-21 DIAGNOSIS — R197 Diarrhea, unspecified: Secondary | ICD-10-CM | POA: Diagnosis not present

## 2022-07-21 DIAGNOSIS — E1122 Type 2 diabetes mellitus with diabetic chronic kidney disease: Secondary | ICD-10-CM | POA: Diagnosis not present

## 2022-07-21 DIAGNOSIS — Z7982 Long term (current) use of aspirin: Secondary | ICD-10-CM | POA: Diagnosis not present

## 2022-07-21 DIAGNOSIS — K219 Gastro-esophageal reflux disease without esophagitis: Secondary | ICD-10-CM | POA: Diagnosis not present

## 2022-07-21 DIAGNOSIS — I129 Hypertensive chronic kidney disease with stage 1 through stage 4 chronic kidney disease, or unspecified chronic kidney disease: Secondary | ICD-10-CM | POA: Diagnosis not present

## 2022-07-21 DIAGNOSIS — R109 Unspecified abdominal pain: Secondary | ICD-10-CM | POA: Diagnosis not present

## 2022-07-21 DIAGNOSIS — G713 Mitochondrial myopathy, not elsewhere classified: Secondary | ICD-10-CM | POA: Diagnosis not present

## 2022-07-21 DIAGNOSIS — I10 Essential (primary) hypertension: Secondary | ICD-10-CM | POA: Diagnosis not present

## 2022-07-21 DIAGNOSIS — Z79899 Other long term (current) drug therapy: Secondary | ICD-10-CM | POA: Diagnosis not present

## 2022-07-21 DIAGNOSIS — R9431 Abnormal electrocardiogram [ECG] [EKG]: Secondary | ICD-10-CM | POA: Diagnosis not present

## 2022-07-26 ENCOUNTER — Other Ambulatory Visit: Payer: Self-pay | Admitting: Internal Medicine

## 2022-07-26 DIAGNOSIS — G7000 Myasthenia gravis without (acute) exacerbation: Secondary | ICD-10-CM | POA: Diagnosis not present

## 2022-07-26 DIAGNOSIS — M069 Rheumatoid arthritis, unspecified: Secondary | ICD-10-CM | POA: Diagnosis not present

## 2022-07-26 DIAGNOSIS — R197 Diarrhea, unspecified: Secondary | ICD-10-CM | POA: Diagnosis not present

## 2022-07-26 DIAGNOSIS — R1084 Generalized abdominal pain: Secondary | ICD-10-CM | POA: Diagnosis not present

## 2022-07-26 DIAGNOSIS — Z1231 Encounter for screening mammogram for malignant neoplasm of breast: Secondary | ICD-10-CM

## 2022-07-28 DIAGNOSIS — R1084 Generalized abdominal pain: Secondary | ICD-10-CM | POA: Diagnosis not present

## 2022-07-28 DIAGNOSIS — R197 Diarrhea, unspecified: Secondary | ICD-10-CM | POA: Diagnosis not present

## 2022-08-04 DIAGNOSIS — G4733 Obstructive sleep apnea (adult) (pediatric): Secondary | ICD-10-CM | POA: Diagnosis not present

## 2022-08-04 DIAGNOSIS — R0789 Other chest pain: Secondary | ICD-10-CM | POA: Diagnosis not present

## 2022-08-04 DIAGNOSIS — Z6828 Body mass index (BMI) 28.0-28.9, adult: Secondary | ICD-10-CM | POA: Diagnosis not present

## 2022-08-17 DIAGNOSIS — R197 Diarrhea, unspecified: Secondary | ICD-10-CM | POA: Diagnosis not present

## 2022-08-17 DIAGNOSIS — K6389 Other specified diseases of intestine: Secondary | ICD-10-CM | POA: Diagnosis not present

## 2022-08-24 ENCOUNTER — Encounter: Payer: Self-pay | Admitting: Gastroenterology

## 2022-08-29 ENCOUNTER — Other Ambulatory Visit: Payer: Self-pay | Admitting: Gastroenterology

## 2022-08-29 DIAGNOSIS — R935 Abnormal findings on diagnostic imaging of other abdominal regions, including retroperitoneum: Secondary | ICD-10-CM

## 2022-09-05 DIAGNOSIS — G2581 Restless legs syndrome: Secondary | ICD-10-CM | POA: Diagnosis not present

## 2022-09-05 DIAGNOSIS — M797 Fibromyalgia: Secondary | ICD-10-CM | POA: Diagnosis not present

## 2022-09-05 DIAGNOSIS — N390 Urinary tract infection, site not specified: Secondary | ICD-10-CM | POA: Diagnosis not present

## 2022-09-05 DIAGNOSIS — G43019 Migraine without aura, intractable, without status migrainosus: Secondary | ICD-10-CM | POA: Diagnosis not present

## 2022-09-05 DIAGNOSIS — G4733 Obstructive sleep apnea (adult) (pediatric): Secondary | ICD-10-CM | POA: Diagnosis not present

## 2022-09-05 DIAGNOSIS — G713 Mitochondrial myopathy, not elsewhere classified: Secondary | ICD-10-CM | POA: Diagnosis not present

## 2022-09-08 DIAGNOSIS — R935 Abnormal findings on diagnostic imaging of other abdominal regions, including retroperitoneum: Secondary | ICD-10-CM | POA: Diagnosis not present

## 2022-09-08 DIAGNOSIS — K6389 Other specified diseases of intestine: Secondary | ICD-10-CM | POA: Diagnosis not present

## 2022-09-19 DIAGNOSIS — G43709 Chronic migraine without aura, not intractable, without status migrainosus: Secondary | ICD-10-CM | POA: Diagnosis not present

## 2022-09-19 DIAGNOSIS — G43019 Migraine without aura, intractable, without status migrainosus: Secondary | ICD-10-CM | POA: Diagnosis not present

## 2022-09-19 DIAGNOSIS — G713 Mitochondrial myopathy, not elsewhere classified: Secondary | ICD-10-CM | POA: Diagnosis not present

## 2022-09-21 DIAGNOSIS — T63481A Toxic effect of venom of other arthropod, accidental (unintentional), initial encounter: Secondary | ICD-10-CM | POA: Diagnosis not present

## 2022-09-21 DIAGNOSIS — R202 Paresthesia of skin: Secondary | ICD-10-CM | POA: Diagnosis not present

## 2022-09-21 DIAGNOSIS — L299 Pruritus, unspecified: Secondary | ICD-10-CM | POA: Diagnosis not present

## 2022-10-05 DIAGNOSIS — M791 Myalgia, unspecified site: Secondary | ICD-10-CM | POA: Diagnosis not present

## 2022-10-05 DIAGNOSIS — R110 Nausea: Secondary | ICD-10-CM | POA: Diagnosis not present

## 2022-10-05 DIAGNOSIS — R531 Weakness: Secondary | ICD-10-CM | POA: Diagnosis not present

## 2022-10-10 DIAGNOSIS — R531 Weakness: Secondary | ICD-10-CM | POA: Diagnosis not present

## 2022-10-10 DIAGNOSIS — R1084 Generalized abdominal pain: Secondary | ICD-10-CM | POA: Diagnosis not present

## 2022-10-10 DIAGNOSIS — R110 Nausea: Secondary | ICD-10-CM | POA: Diagnosis not present

## 2022-10-10 DIAGNOSIS — M791 Myalgia, unspecified site: Secondary | ICD-10-CM | POA: Diagnosis not present

## 2022-10-20 DIAGNOSIS — G713 Mitochondrial myopathy, not elsewhere classified: Secondary | ICD-10-CM | POA: Diagnosis not present

## 2022-10-24 ENCOUNTER — Ambulatory Visit
Admission: RE | Admit: 2022-10-24 | Discharge: 2022-10-24 | Disposition: A | Discharge status: Home / Self Care | Payer: Medicare HMO | Source: Ambulatory Visit | Attending: Internal Medicine | Admitting: Internal Medicine

## 2022-10-24 DIAGNOSIS — Z1231 Encounter for screening mammogram for malignant neoplasm of breast: Secondary | ICD-10-CM | POA: Insufficient documentation

## 2022-10-24 DIAGNOSIS — E039 Hypothyroidism, unspecified: Secondary | ICD-10-CM | POA: Diagnosis not present

## 2022-10-24 DIAGNOSIS — R519 Headache, unspecified: Secondary | ICD-10-CM | POA: Diagnosis not present

## 2022-10-24 DIAGNOSIS — K5909 Other constipation: Secondary | ICD-10-CM | POA: Diagnosis not present

## 2022-10-24 DIAGNOSIS — M7582 Other shoulder lesions, left shoulder: Secondary | ICD-10-CM | POA: Diagnosis not present

## 2022-10-24 DIAGNOSIS — M797 Fibromyalgia: Secondary | ICD-10-CM | POA: Diagnosis not present

## 2022-10-24 DIAGNOSIS — G713 Mitochondrial myopathy, not elsewhere classified: Secondary | ICD-10-CM | POA: Diagnosis not present

## 2022-10-24 DIAGNOSIS — N1832 Chronic kidney disease, stage 3b: Secondary | ICD-10-CM | POA: Diagnosis not present

## 2022-10-24 DIAGNOSIS — I10 Essential (primary) hypertension: Secondary | ICD-10-CM | POA: Diagnosis not present

## 2022-10-24 DIAGNOSIS — R8290 Unspecified abnormal findings in urine: Secondary | ICD-10-CM | POA: Diagnosis not present

## 2022-10-31 DIAGNOSIS — K3184 Gastroparesis: Secondary | ICD-10-CM | POA: Diagnosis not present

## 2022-10-31 DIAGNOSIS — G4733 Obstructive sleep apnea (adult) (pediatric): Secondary | ICD-10-CM | POA: Diagnosis not present

## 2022-10-31 DIAGNOSIS — Z0000 Encounter for general adult medical examination without abnormal findings: Secondary | ICD-10-CM | POA: Diagnosis not present

## 2022-10-31 DIAGNOSIS — R7303 Prediabetes: Secondary | ICD-10-CM | POA: Diagnosis not present

## 2022-10-31 DIAGNOSIS — E039 Hypothyroidism, unspecified: Secondary | ICD-10-CM | POA: Diagnosis not present

## 2022-10-31 DIAGNOSIS — G713 Mitochondrial myopathy, not elsewhere classified: Secondary | ICD-10-CM | POA: Diagnosis not present

## 2022-10-31 DIAGNOSIS — R197 Diarrhea, unspecified: Secondary | ICD-10-CM | POA: Diagnosis not present

## 2022-11-06 DIAGNOSIS — I700 Atherosclerosis of aorta: Secondary | ICD-10-CM | POA: Diagnosis not present

## 2022-11-06 DIAGNOSIS — R112 Nausea with vomiting, unspecified: Secondary | ICD-10-CM | POA: Diagnosis not present

## 2022-11-06 DIAGNOSIS — R42 Dizziness and giddiness: Secondary | ICD-10-CM | POA: Diagnosis not present

## 2022-11-06 DIAGNOSIS — R1013 Epigastric pain: Secondary | ICD-10-CM | POA: Diagnosis not present

## 2022-11-06 DIAGNOSIS — R109 Unspecified abdominal pain: Secondary | ICD-10-CM | POA: Diagnosis not present

## 2022-11-30 DIAGNOSIS — R110 Nausea: Secondary | ICD-10-CM | POA: Diagnosis not present

## 2022-12-06 DIAGNOSIS — M5412 Radiculopathy, cervical region: Secondary | ICD-10-CM | POA: Diagnosis not present

## 2022-12-06 DIAGNOSIS — M542 Cervicalgia: Secondary | ICD-10-CM | POA: Diagnosis not present

## 2022-12-08 DIAGNOSIS — Z01411 Encounter for gynecological examination (general) (routine) with abnormal findings: Secondary | ICD-10-CM | POA: Diagnosis not present

## 2022-12-08 DIAGNOSIS — R000 Tachycardia, unspecified: Secondary | ICD-10-CM | POA: Diagnosis not present

## 2022-12-08 DIAGNOSIS — Z1339 Encounter for screening examination for other mental health and behavioral disorders: Secondary | ICD-10-CM | POA: Diagnosis not present

## 2022-12-08 DIAGNOSIS — Z1331 Encounter for screening for depression: Secondary | ICD-10-CM | POA: Diagnosis not present

## 2022-12-08 DIAGNOSIS — E876 Hypokalemia: Secondary | ICD-10-CM | POA: Diagnosis not present

## 2022-12-18 DIAGNOSIS — I10 Essential (primary) hypertension: Secondary | ICD-10-CM | POA: Diagnosis not present

## 2022-12-18 DIAGNOSIS — E782 Mixed hyperlipidemia: Secondary | ICD-10-CM | POA: Diagnosis not present

## 2022-12-18 DIAGNOSIS — R002 Palpitations: Secondary | ICD-10-CM | POA: Diagnosis not present

## 2022-12-18 DIAGNOSIS — R0789 Other chest pain: Secondary | ICD-10-CM | POA: Diagnosis not present

## 2022-12-25 DIAGNOSIS — G713 Mitochondrial myopathy, not elsewhere classified: Secondary | ICD-10-CM | POA: Diagnosis not present

## 2023-01-01 DIAGNOSIS — R002 Palpitations: Secondary | ICD-10-CM | POA: Diagnosis not present

## 2023-01-01 DIAGNOSIS — R0789 Other chest pain: Secondary | ICD-10-CM | POA: Diagnosis not present

## 2023-01-09 DIAGNOSIS — N1832 Chronic kidney disease, stage 3b: Secondary | ICD-10-CM | POA: Diagnosis not present

## 2023-01-12 DIAGNOSIS — M4312 Spondylolisthesis, cervical region: Secondary | ICD-10-CM | POA: Diagnosis not present

## 2023-01-12 DIAGNOSIS — M4802 Spinal stenosis, cervical region: Secondary | ICD-10-CM | POA: Diagnosis not present

## 2023-01-12 DIAGNOSIS — M4722 Other spondylosis with radiculopathy, cervical region: Secondary | ICD-10-CM | POA: Diagnosis not present

## 2023-01-12 DIAGNOSIS — M5011 Cervical disc disorder with radiculopathy,  high cervical region: Secondary | ICD-10-CM | POA: Diagnosis not present

## 2023-01-16 DIAGNOSIS — M4642 Discitis, unspecified, cervical region: Secondary | ICD-10-CM | POA: Diagnosis not present

## 2023-01-23 DIAGNOSIS — G43019 Migraine without aura, intractable, without status migrainosus: Secondary | ICD-10-CM | POA: Diagnosis not present

## 2023-01-24 DIAGNOSIS — M542 Cervicalgia: Secondary | ICD-10-CM | POA: Diagnosis not present

## 2023-01-24 DIAGNOSIS — M5412 Radiculopathy, cervical region: Secondary | ICD-10-CM | POA: Diagnosis not present

## 2023-01-25 ENCOUNTER — Other Ambulatory Visit: Payer: Self-pay | Admitting: Physical Medicine & Rehabilitation

## 2023-01-25 DIAGNOSIS — M5412 Radiculopathy, cervical region: Secondary | ICD-10-CM

## 2023-01-29 DIAGNOSIS — R399 Unspecified symptoms and signs involving the genitourinary system: Secondary | ICD-10-CM | POA: Diagnosis not present

## 2023-01-29 DIAGNOSIS — I77810 Thoracic aortic ectasia: Secondary | ICD-10-CM | POA: Diagnosis not present

## 2023-01-29 DIAGNOSIS — E782 Mixed hyperlipidemia: Secondary | ICD-10-CM | POA: Diagnosis not present

## 2023-01-29 DIAGNOSIS — I10 Essential (primary) hypertension: Secondary | ICD-10-CM | POA: Diagnosis not present

## 2023-02-08 DIAGNOSIS — J449 Chronic obstructive pulmonary disease, unspecified: Secondary | ICD-10-CM | POA: Diagnosis not present

## 2023-02-08 DIAGNOSIS — R112 Nausea with vomiting, unspecified: Secondary | ICD-10-CM | POA: Diagnosis not present

## 2023-02-08 DIAGNOSIS — G713 Mitochondrial myopathy, not elsewhere classified: Secondary | ICD-10-CM | POA: Diagnosis not present

## 2023-02-08 DIAGNOSIS — Z79899 Other long term (current) drug therapy: Secondary | ICD-10-CM | POA: Diagnosis not present

## 2023-02-08 DIAGNOSIS — G4733 Obstructive sleep apnea (adult) (pediatric): Secondary | ICD-10-CM | POA: Diagnosis not present

## 2023-02-08 DIAGNOSIS — R7303 Prediabetes: Secondary | ICD-10-CM | POA: Diagnosis not present

## 2023-02-08 DIAGNOSIS — R0902 Hypoxemia: Secondary | ICD-10-CM | POA: Diagnosis not present

## 2023-02-08 DIAGNOSIS — K3184 Gastroparesis: Secondary | ICD-10-CM | POA: Diagnosis not present

## 2023-02-08 DIAGNOSIS — R140 Abdominal distension (gaseous): Secondary | ICD-10-CM | POA: Diagnosis not present

## 2023-02-08 DIAGNOSIS — E039 Hypothyroidism, unspecified: Secondary | ICD-10-CM | POA: Diagnosis not present

## 2023-02-08 DIAGNOSIS — I129 Hypertensive chronic kidney disease with stage 1 through stage 4 chronic kidney disease, or unspecified chronic kidney disease: Secondary | ICD-10-CM | POA: Diagnosis not present

## 2023-02-08 DIAGNOSIS — Z0000 Encounter for general adult medical examination without abnormal findings: Secondary | ICD-10-CM | POA: Diagnosis not present

## 2023-02-08 DIAGNOSIS — N1832 Chronic kidney disease, stage 3b: Secondary | ICD-10-CM | POA: Diagnosis not present

## 2023-02-08 DIAGNOSIS — R7309 Other abnormal glucose: Secondary | ICD-10-CM | POA: Diagnosis not present

## 2023-02-08 DIAGNOSIS — R0602 Shortness of breath: Secondary | ICD-10-CM | POA: Diagnosis not present

## 2023-02-08 DIAGNOSIS — I10 Essential (primary) hypertension: Secondary | ICD-10-CM | POA: Diagnosis not present

## 2023-02-09 DIAGNOSIS — R531 Weakness: Secondary | ICD-10-CM | POA: Diagnosis not present

## 2023-02-09 DIAGNOSIS — R630 Anorexia: Secondary | ICD-10-CM | POA: Diagnosis not present

## 2023-02-09 DIAGNOSIS — G713 Mitochondrial myopathy, not elsewhere classified: Secondary | ICD-10-CM | POA: Diagnosis not present

## 2023-02-20 DIAGNOSIS — R519 Headache, unspecified: Secondary | ICD-10-CM | POA: Diagnosis not present

## 2023-02-20 DIAGNOSIS — R413 Other amnesia: Secondary | ICD-10-CM | POA: Diagnosis not present

## 2023-02-20 DIAGNOSIS — N289 Disorder of kidney and ureter, unspecified: Secondary | ICD-10-CM | POA: Diagnosis not present

## 2023-02-20 DIAGNOSIS — I10 Essential (primary) hypertension: Secondary | ICD-10-CM | POA: Diagnosis not present

## 2023-02-20 DIAGNOSIS — M797 Fibromyalgia: Secondary | ICD-10-CM | POA: Diagnosis not present

## 2023-02-20 DIAGNOSIS — G713 Mitochondrial myopathy, not elsewhere classified: Secondary | ICD-10-CM | POA: Diagnosis not present

## 2023-02-20 DIAGNOSIS — K3184 Gastroparesis: Secondary | ICD-10-CM | POA: Diagnosis not present

## 2023-02-20 DIAGNOSIS — N390 Urinary tract infection, site not specified: Secondary | ICD-10-CM | POA: Diagnosis not present

## 2023-02-20 DIAGNOSIS — R634 Abnormal weight loss: Secondary | ICD-10-CM | POA: Diagnosis not present

## 2023-02-23 DIAGNOSIS — K3184 Gastroparesis: Secondary | ICD-10-CM | POA: Diagnosis not present

## 2023-02-23 DIAGNOSIS — D72829 Elevated white blood cell count, unspecified: Secondary | ICD-10-CM | POA: Diagnosis not present

## 2023-02-23 DIAGNOSIS — E871 Hypo-osmolality and hyponatremia: Secondary | ICD-10-CM | POA: Diagnosis not present

## 2023-02-23 DIAGNOSIS — K5900 Constipation, unspecified: Secondary | ICD-10-CM | POA: Diagnosis not present

## 2023-02-23 DIAGNOSIS — Z5986 Financial insecurity: Secondary | ICD-10-CM | POA: Diagnosis not present

## 2023-02-23 DIAGNOSIS — K529 Noninfective gastroenteritis and colitis, unspecified: Secondary | ICD-10-CM | POA: Diagnosis not present

## 2023-02-23 DIAGNOSIS — R1011 Right upper quadrant pain: Secondary | ICD-10-CM | POA: Diagnosis not present

## 2023-02-23 DIAGNOSIS — I10 Essential (primary) hypertension: Secondary | ICD-10-CM | POA: Diagnosis not present

## 2023-02-23 DIAGNOSIS — J449 Chronic obstructive pulmonary disease, unspecified: Secondary | ICD-10-CM | POA: Diagnosis not present

## 2023-02-23 DIAGNOSIS — R109 Unspecified abdominal pain: Secondary | ICD-10-CM | POA: Diagnosis not present

## 2023-02-23 DIAGNOSIS — K5939 Other megacolon: Secondary | ICD-10-CM | POA: Diagnosis not present

## 2023-02-23 DIAGNOSIS — E88819 Insulin resistance, unspecified: Secondary | ICD-10-CM | POA: Diagnosis not present

## 2023-02-23 DIAGNOSIS — R112 Nausea with vomiting, unspecified: Secondary | ICD-10-CM | POA: Diagnosis not present

## 2023-02-23 DIAGNOSIS — M797 Fibromyalgia: Secondary | ICD-10-CM | POA: Diagnosis not present

## 2023-02-23 DIAGNOSIS — E875 Hyperkalemia: Secondary | ICD-10-CM | POA: Diagnosis not present

## 2023-02-28 ENCOUNTER — Ambulatory Visit
Admission: RE | Admit: 2023-02-28 | Discharge: 2023-02-28 | Disposition: A | Discharge status: Home / Self Care | Payer: Medicare HMO | Source: Ambulatory Visit | Attending: Physical Medicine & Rehabilitation | Admitting: Physical Medicine & Rehabilitation

## 2023-02-28 DIAGNOSIS — M50122 Cervical disc disorder at C5-C6 level with radiculopathy: Secondary | ICD-10-CM | POA: Diagnosis not present

## 2023-02-28 DIAGNOSIS — M5011 Cervical disc disorder with radiculopathy,  high cervical region: Secondary | ICD-10-CM | POA: Diagnosis not present

## 2023-02-28 DIAGNOSIS — H532 Diplopia: Secondary | ICD-10-CM | POA: Diagnosis not present

## 2023-02-28 DIAGNOSIS — M5412 Radiculopathy, cervical region: Secondary | ICD-10-CM

## 2023-02-28 DIAGNOSIS — M4722 Other spondylosis with radiculopathy, cervical region: Secondary | ICD-10-CM | POA: Diagnosis not present

## 2023-02-28 DIAGNOSIS — Z0100 Encounter for examination of eyes and vision without abnormal findings: Secondary | ICD-10-CM | POA: Diagnosis not present

## 2023-02-28 MED ORDER — TRIAMCINOLONE ACETONIDE 40 MG/ML IJ SUSP (RADIOLOGY): 60.0000 mg | Freq: Once | INTRAMUSCULAR | Status: DC

## 2023-02-28 MED ORDER — IOPAMIDOL (ISOVUE-M 300) INJECTION 61%
1.0000 mL | Freq: Once | INTRAMUSCULAR | Status: AC
  Administered 2023-02-28: 1 mL via EPIDURAL

## 2023-02-28 NOTE — Discharge Instructions (Signed)
Post Procedure Spinal Discharge Instruction Sheet  You may resume a regular diet and any medications that you routinely take (including pain medications) unless otherwise noted by MD.  No driving day of procedure.  Light activity throughout the rest of the day.  Do not do any strenuous work, exercise, bending or lifting.  The day following the procedure, you can resume normal physical activity but you should refrain from exercising or physical therapy for at least three days thereafter.  You may apply ice to the injection site, 20 minutes on, 20 minutes off, as needed. Do not apply ice directly to skin.    Common Side Effects:  Headaches- take your usual medications as directed by your physician.  Increase your fluid intake.  Caffeinated beverages may be helpful.  Lie flat in bed until your headache resolves.  Restlessness or inability to sleep- you may have trouble sleeping for the next few days.  Ask your referring physician if you need any medication for sleep.  Facial flushing or redness- should subside within a few days.  Increased pain- a temporary increase in pain a day or two following your procedure is not unusual.  Take your pain medication as prescribed by your referring physician.  Leg cramps  Please contact our office at [phone] for the following symptoms: Fever greater than 100 degrees. Headaches unresolved with medication after 2-3 days. Increased swelling, pain, or redness at injection site.   May resume aspirin immediately after injection.  Thank you for visiting [facility]!

## 2023-03-05 DIAGNOSIS — G713 Mitochondrial myopathy, not elsewhere classified: Secondary | ICD-10-CM | POA: Diagnosis not present

## 2023-03-05 DIAGNOSIS — R4189 Other symptoms and signs involving cognitive functions and awareness: Secondary | ICD-10-CM | POA: Diagnosis not present

## 2023-03-05 DIAGNOSIS — G2581 Restless legs syndrome: Secondary | ICD-10-CM | POA: Diagnosis not present

## 2023-03-05 DIAGNOSIS — M797 Fibromyalgia: Secondary | ICD-10-CM | POA: Diagnosis not present

## 2023-03-05 DIAGNOSIS — G43019 Migraine without aura, intractable, without status migrainosus: Secondary | ICD-10-CM | POA: Diagnosis not present

## 2023-03-05 DIAGNOSIS — R531 Weakness: Secondary | ICD-10-CM | POA: Diagnosis not present

## 2023-03-06 ENCOUNTER — Encounter: Payer: Self-pay | Admitting: Student

## 2023-03-06 ENCOUNTER — Other Ambulatory Visit: Payer: Self-pay | Admitting: Student

## 2023-03-06 DIAGNOSIS — R4189 Other symptoms and signs involving cognitive functions and awareness: Secondary | ICD-10-CM

## 2023-03-06 DIAGNOSIS — J069 Acute upper respiratory infection, unspecified: Secondary | ICD-10-CM | POA: Diagnosis not present

## 2023-03-07 ENCOUNTER — Other Ambulatory Visit: Payer: Self-pay | Admitting: Physical Medicine & Rehabilitation

## 2023-03-07 DIAGNOSIS — M5412 Radiculopathy, cervical region: Secondary | ICD-10-CM

## 2023-03-13 DIAGNOSIS — R110 Nausea: Secondary | ICD-10-CM | POA: Diagnosis not present

## 2023-03-13 DIAGNOSIS — K3184 Gastroparesis: Secondary | ICD-10-CM | POA: Diagnosis not present

## 2023-03-13 DIAGNOSIS — Z79899 Other long term (current) drug therapy: Secondary | ICD-10-CM | POA: Diagnosis not present

## 2023-03-14 ENCOUNTER — Ambulatory Visit
Admission: RE | Admit: 2023-03-14 | Discharge: 2023-03-14 | Disposition: A | Discharge status: Home / Self Care | Payer: Medicare HMO | Source: Ambulatory Visit | Attending: Student | Admitting: Student

## 2023-03-14 DIAGNOSIS — G43909 Migraine, unspecified, not intractable, without status migrainosus: Secondary | ICD-10-CM | POA: Diagnosis not present

## 2023-03-14 DIAGNOSIS — R410 Disorientation, unspecified: Secondary | ICD-10-CM | POA: Diagnosis not present

## 2023-03-14 DIAGNOSIS — R4189 Other symptoms and signs involving cognitive functions and awareness: Secondary | ICD-10-CM

## 2023-03-29 NOTE — Discharge Instructions (Signed)

## 2023-03-30 ENCOUNTER — Ambulatory Visit
Admission: RE | Admit: 2023-03-30 | Discharge: 2023-03-30 | Disposition: A | Discharge status: Home / Self Care | Payer: Medicare HMO | Source: Ambulatory Visit | Attending: Physical Medicine & Rehabilitation | Admitting: Physical Medicine & Rehabilitation

## 2023-03-30 DIAGNOSIS — M5412 Radiculopathy, cervical region: Secondary | ICD-10-CM

## 2023-03-30 DIAGNOSIS — M4722 Other spondylosis with radiculopathy, cervical region: Secondary | ICD-10-CM | POA: Diagnosis not present

## 2023-03-30 MED ORDER — IOPAMIDOL (ISOVUE-M 300) INJECTION 61%
1.0000 mL | Freq: Once | INTRAMUSCULAR | Status: AC | PRN
  Administered 2023-03-30: 1 mL via EPIDURAL

## 2023-03-30 MED ORDER — TRIAMCINOLONE ACETONIDE 40 MG/ML IJ SUSP (RADIOLOGY)
60.0000 mg | Freq: Once | INTRAMUSCULAR | Status: AC
  Administered 2023-03-30: 60 mg via EPIDURAL

## 2023-04-18 ENCOUNTER — Other Ambulatory Visit: Payer: Self-pay | Admitting: Physical Medicine & Rehabilitation

## 2023-04-18 DIAGNOSIS — M9931 Osseous stenosis of neural canal of cervical region: Secondary | ICD-10-CM | POA: Diagnosis not present

## 2023-04-18 DIAGNOSIS — M542 Cervicalgia: Secondary | ICD-10-CM | POA: Diagnosis not present

## 2023-04-18 DIAGNOSIS — M5412 Radiculopathy, cervical region: Secondary | ICD-10-CM

## 2023-04-27 ENCOUNTER — Ambulatory Visit
Admission: RE | Admit: 2023-04-27 | Discharge: 2023-04-27 | Disposition: A | Discharge status: Home / Self Care | Payer: Medicare HMO | Source: Ambulatory Visit | Attending: Physical Medicine & Rehabilitation | Admitting: Physical Medicine & Rehabilitation

## 2023-04-27 DIAGNOSIS — M5412 Radiculopathy, cervical region: Secondary | ICD-10-CM

## 2023-04-27 DIAGNOSIS — M4722 Other spondylosis with radiculopathy, cervical region: Secondary | ICD-10-CM | POA: Diagnosis not present

## 2023-04-27 MED ORDER — IOPAMIDOL (ISOVUE-M 300) INJECTION 61%
1.0000 mL | Freq: Once | INTRAMUSCULAR | Status: AC | PRN
  Administered 2023-04-27: 1 mL via EPIDURAL

## 2023-04-27 MED ORDER — TRIAMCINOLONE ACETONIDE 40 MG/ML IJ SUSP (RADIOLOGY)
60.0000 mg | Freq: Once | INTRAMUSCULAR | Status: AC
  Administered 2023-04-27: 60 mg via EPIDURAL

## 2023-04-27 NOTE — Discharge Instructions (Signed)

## 2023-05-11 DIAGNOSIS — E610 Copper deficiency: Secondary | ICD-10-CM | POA: Diagnosis not present

## 2023-05-11 DIAGNOSIS — R531 Weakness: Secondary | ICD-10-CM | POA: Diagnosis not present

## 2023-05-11 DIAGNOSIS — G713 Mitochondrial myopathy, not elsewhere classified: Secondary | ICD-10-CM | POA: Diagnosis not present

## 2023-05-14 DIAGNOSIS — Z79899 Other long term (current) drug therapy: Secondary | ICD-10-CM | POA: Diagnosis not present

## 2023-05-16 DIAGNOSIS — H02423 Myogenic ptosis of bilateral eyelids: Secondary | ICD-10-CM | POA: Diagnosis not present

## 2023-05-16 DIAGNOSIS — H5111 Convergence insufficiency: Secondary | ICD-10-CM | POA: Diagnosis not present

## 2023-05-16 DIAGNOSIS — H518 Other specified disorders of binocular movement: Secondary | ICD-10-CM | POA: Diagnosis not present

## 2023-05-16 DIAGNOSIS — H532 Diplopia: Secondary | ICD-10-CM | POA: Diagnosis not present

## 2023-05-16 DIAGNOSIS — H519 Unspecified disorder of binocular movement: Secondary | ICD-10-CM | POA: Diagnosis not present

## 2023-05-16 DIAGNOSIS — H49813 Kearns-Sayre syndrome, bilateral: Secondary | ICD-10-CM | POA: Diagnosis not present

## 2023-05-16 DIAGNOSIS — H49883 Other paralytic strabismus, bilateral: Secondary | ICD-10-CM | POA: Diagnosis not present

## 2023-05-17 DIAGNOSIS — G8929 Other chronic pain: Secondary | ICD-10-CM | POA: Diagnosis not present

## 2023-05-17 DIAGNOSIS — M5412 Radiculopathy, cervical region: Secondary | ICD-10-CM | POA: Diagnosis not present

## 2023-05-17 DIAGNOSIS — M25511 Pain in right shoulder: Secondary | ICD-10-CM | POA: Diagnosis not present

## 2023-05-17 DIAGNOSIS — M542 Cervicalgia: Secondary | ICD-10-CM | POA: Diagnosis not present

## 2023-06-05 DIAGNOSIS — M4802 Spinal stenosis, cervical region: Secondary | ICD-10-CM | POA: Diagnosis not present

## 2023-06-05 DIAGNOSIS — Z981 Arthrodesis status: Secondary | ICD-10-CM | POA: Diagnosis not present

## 2023-06-05 DIAGNOSIS — M4722 Other spondylosis with radiculopathy, cervical region: Secondary | ICD-10-CM | POA: Diagnosis not present

## 2023-06-05 DIAGNOSIS — M5412 Radiculopathy, cervical region: Secondary | ICD-10-CM | POA: Diagnosis not present

## 2023-06-05 DIAGNOSIS — M4312 Spondylolisthesis, cervical region: Secondary | ICD-10-CM | POA: Diagnosis not present

## 2023-06-08 DIAGNOSIS — M797 Fibromyalgia: Secondary | ICD-10-CM | POA: Diagnosis not present

## 2023-06-08 DIAGNOSIS — R4189 Other symptoms and signs involving cognitive functions and awareness: Secondary | ICD-10-CM | POA: Diagnosis not present

## 2023-06-08 DIAGNOSIS — E610 Copper deficiency: Secondary | ICD-10-CM | POA: Diagnosis not present

## 2023-06-08 DIAGNOSIS — G2581 Restless legs syndrome: Secondary | ICD-10-CM | POA: Diagnosis not present

## 2023-06-08 DIAGNOSIS — R531 Weakness: Secondary | ICD-10-CM | POA: Diagnosis not present

## 2023-06-08 DIAGNOSIS — G713 Mitochondrial myopathy, not elsewhere classified: Secondary | ICD-10-CM | POA: Diagnosis not present

## 2023-06-14 DIAGNOSIS — R413 Other amnesia: Secondary | ICD-10-CM | POA: Diagnosis not present

## 2023-06-14 DIAGNOSIS — N1832 Chronic kidney disease, stage 3b: Secondary | ICD-10-CM | POA: Diagnosis not present

## 2023-06-14 DIAGNOSIS — G713 Mitochondrial myopathy, not elsewhere classified: Secondary | ICD-10-CM | POA: Diagnosis not present

## 2023-06-14 DIAGNOSIS — R8290 Unspecified abnormal findings in urine: Secondary | ICD-10-CM | POA: Diagnosis not present

## 2023-06-14 DIAGNOSIS — I10 Essential (primary) hypertension: Secondary | ICD-10-CM | POA: Diagnosis not present

## 2023-06-14 DIAGNOSIS — N390 Urinary tract infection, site not specified: Secondary | ICD-10-CM | POA: Diagnosis not present

## 2023-06-14 DIAGNOSIS — R519 Headache, unspecified: Secondary | ICD-10-CM | POA: Diagnosis not present

## 2023-06-14 DIAGNOSIS — M797 Fibromyalgia: Secondary | ICD-10-CM | POA: Diagnosis not present

## 2023-06-14 DIAGNOSIS — K3184 Gastroparesis: Secondary | ICD-10-CM | POA: Diagnosis not present

## 2023-06-14 DIAGNOSIS — R112 Nausea with vomiting, unspecified: Secondary | ICD-10-CM | POA: Diagnosis not present

## 2023-06-21 DIAGNOSIS — Z0000 Encounter for general adult medical examination without abnormal findings: Secondary | ICD-10-CM | POA: Diagnosis not present

## 2023-06-21 DIAGNOSIS — I10 Essential (primary) hypertension: Secondary | ICD-10-CM | POA: Diagnosis not present

## 2023-06-21 DIAGNOSIS — K3184 Gastroparesis: Secondary | ICD-10-CM | POA: Diagnosis not present

## 2023-06-21 DIAGNOSIS — M5412 Radiculopathy, cervical region: Secondary | ICD-10-CM | POA: Diagnosis not present

## 2023-06-21 DIAGNOSIS — M797 Fibromyalgia: Secondary | ICD-10-CM | POA: Diagnosis not present

## 2023-06-21 DIAGNOSIS — N289 Disorder of kidney and ureter, unspecified: Secondary | ICD-10-CM | POA: Diagnosis not present

## 2023-06-21 DIAGNOSIS — N390 Urinary tract infection, site not specified: Secondary | ICD-10-CM | POA: Diagnosis not present

## 2023-06-21 DIAGNOSIS — Z1331 Encounter for screening for depression: Secondary | ICD-10-CM | POA: Diagnosis not present

## 2023-06-22 DIAGNOSIS — M4312 Spondylolisthesis, cervical region: Secondary | ICD-10-CM | POA: Diagnosis not present

## 2023-06-22 DIAGNOSIS — M519 Unspecified thoracic, thoracolumbar and lumbosacral intervertebral disc disorder: Secondary | ICD-10-CM | POA: Diagnosis not present

## 2023-06-22 DIAGNOSIS — M5010 Cervical disc disorder with radiculopathy, unspecified cervical region: Secondary | ICD-10-CM | POA: Diagnosis not present

## 2023-06-22 DIAGNOSIS — M4802 Spinal stenosis, cervical region: Secondary | ICD-10-CM | POA: Diagnosis not present

## 2023-06-22 DIAGNOSIS — M5011 Cervical disc disorder with radiculopathy,  high cervical region: Secondary | ICD-10-CM | POA: Diagnosis not present

## 2023-06-22 DIAGNOSIS — M5412 Radiculopathy, cervical region: Secondary | ICD-10-CM | POA: Diagnosis not present

## 2023-06-22 DIAGNOSIS — M40292 Other kyphosis, cervical region: Secondary | ICD-10-CM | POA: Diagnosis not present

## 2023-06-22 DIAGNOSIS — M4722 Other spondylosis with radiculopathy, cervical region: Secondary | ICD-10-CM | POA: Diagnosis not present

## 2023-06-25 DIAGNOSIS — K3184 Gastroparesis: Secondary | ICD-10-CM | POA: Diagnosis not present

## 2023-06-28 DIAGNOSIS — R531 Weakness: Secondary | ICD-10-CM | POA: Diagnosis not present

## 2023-06-28 DIAGNOSIS — E610 Copper deficiency: Secondary | ICD-10-CM | POA: Diagnosis not present

## 2023-06-28 DIAGNOSIS — G713 Mitochondrial myopathy, not elsewhere classified: Secondary | ICD-10-CM | POA: Diagnosis not present

## 2023-06-29 DIAGNOSIS — M810 Age-related osteoporosis without current pathological fracture: Secondary | ICD-10-CM | POA: Diagnosis not present

## 2023-07-06 DIAGNOSIS — M818 Other osteoporosis without current pathological fracture: Secondary | ICD-10-CM | POA: Diagnosis not present

## 2023-07-06 DIAGNOSIS — M4012 Other secondary kyphosis, cervical region: Secondary | ICD-10-CM | POA: Diagnosis not present

## 2023-07-06 DIAGNOSIS — R1319 Other dysphagia: Secondary | ICD-10-CM | POA: Diagnosis not present

## 2023-07-06 DIAGNOSIS — G713 Mitochondrial myopathy, not elsewhere classified: Secondary | ICD-10-CM | POA: Diagnosis not present

## 2023-07-24 DIAGNOSIS — M8000XK Age-related osteoporosis with current pathological fracture, unspecified site, subsequent encounter for fracture with nonunion: Secondary | ICD-10-CM | POA: Diagnosis not present

## 2023-07-30 DIAGNOSIS — I77810 Thoracic aortic ectasia: Secondary | ICD-10-CM | POA: Diagnosis not present

## 2023-07-30 DIAGNOSIS — I10 Essential (primary) hypertension: Secondary | ICD-10-CM | POA: Diagnosis not present

## 2023-07-30 DIAGNOSIS — R002 Palpitations: Secondary | ICD-10-CM | POA: Diagnosis not present

## 2023-07-30 DIAGNOSIS — E782 Mixed hyperlipidemia: Secondary | ICD-10-CM | POA: Diagnosis not present

## 2023-08-09 DIAGNOSIS — E213 Hyperparathyroidism, unspecified: Secondary | ICD-10-CM | POA: Diagnosis not present

## 2023-08-09 DIAGNOSIS — M818 Other osteoporosis without current pathological fracture: Secondary | ICD-10-CM | POA: Diagnosis not present

## 2023-08-13 DIAGNOSIS — E213 Hyperparathyroidism, unspecified: Secondary | ICD-10-CM | POA: Diagnosis not present

## 2023-08-13 DIAGNOSIS — M818 Other osteoporosis without current pathological fracture: Secondary | ICD-10-CM | POA: Diagnosis not present

## 2023-08-13 DIAGNOSIS — R059 Cough, unspecified: Secondary | ICD-10-CM | POA: Diagnosis not present

## 2023-08-13 DIAGNOSIS — R1312 Dysphagia, oropharyngeal phase: Secondary | ICD-10-CM | POA: Diagnosis not present

## 2023-08-13 DIAGNOSIS — R1319 Other dysphagia: Secondary | ICD-10-CM | POA: Diagnosis not present

## 2023-08-16 DIAGNOSIS — G4733 Obstructive sleep apnea (adult) (pediatric): Secondary | ICD-10-CM | POA: Diagnosis not present

## 2023-08-16 DIAGNOSIS — J301 Allergic rhinitis due to pollen: Secondary | ICD-10-CM | POA: Diagnosis not present

## 2023-08-16 DIAGNOSIS — E861 Hypovolemia: Secondary | ICD-10-CM | POA: Diagnosis not present

## 2023-08-16 DIAGNOSIS — R0602 Shortness of breath: Secondary | ICD-10-CM | POA: Diagnosis not present

## 2023-08-17 DIAGNOSIS — E213 Hyperparathyroidism, unspecified: Secondary | ICD-10-CM | POA: Diagnosis not present

## 2023-08-17 DIAGNOSIS — M4012 Other secondary kyphosis, cervical region: Secondary | ICD-10-CM | POA: Diagnosis not present

## 2023-08-17 DIAGNOSIS — M818 Other osteoporosis without current pathological fracture: Secondary | ICD-10-CM | POA: Diagnosis not present

## 2023-08-17 DIAGNOSIS — G713 Mitochondrial myopathy, not elsewhere classified: Secondary | ICD-10-CM | POA: Diagnosis not present

## 2023-08-17 DIAGNOSIS — E041 Nontoxic single thyroid nodule: Secondary | ICD-10-CM | POA: Diagnosis not present

## 2023-08-20 DIAGNOSIS — E213 Hyperparathyroidism, unspecified: Secondary | ICD-10-CM | POA: Diagnosis not present

## 2023-08-24 DIAGNOSIS — E213 Hyperparathyroidism, unspecified: Secondary | ICD-10-CM | POA: Diagnosis not present

## 2023-09-05 DIAGNOSIS — G43019 Migraine without aura, intractable, without status migrainosus: Secondary | ICD-10-CM | POA: Diagnosis not present

## 2023-09-05 DIAGNOSIS — G713 Mitochondrial myopathy, not elsewhere classified: Secondary | ICD-10-CM | POA: Diagnosis not present

## 2023-09-05 DIAGNOSIS — M797 Fibromyalgia: Secondary | ICD-10-CM | POA: Diagnosis not present

## 2023-09-05 DIAGNOSIS — G2581 Restless legs syndrome: Secondary | ICD-10-CM | POA: Diagnosis not present

## 2023-09-05 DIAGNOSIS — I10 Essential (primary) hypertension: Secondary | ICD-10-CM | POA: Diagnosis not present

## 2023-09-06 DIAGNOSIS — K3184 Gastroparesis: Secondary | ICD-10-CM | POA: Diagnosis not present

## 2023-09-07 DIAGNOSIS — M818 Other osteoporosis without current pathological fracture: Secondary | ICD-10-CM | POA: Diagnosis not present

## 2023-09-14 DIAGNOSIS — E610 Copper deficiency: Secondary | ICD-10-CM | POA: Diagnosis not present

## 2023-09-14 DIAGNOSIS — G713 Mitochondrial myopathy, not elsewhere classified: Secondary | ICD-10-CM | POA: Diagnosis not present

## 2023-09-19 DIAGNOSIS — G43909 Migraine, unspecified, not intractable, without status migrainosus: Secondary | ICD-10-CM | POA: Diagnosis not present

## 2023-09-19 DIAGNOSIS — R52 Pain, unspecified: Secondary | ICD-10-CM | POA: Diagnosis not present

## 2023-09-19 DIAGNOSIS — R252 Cramp and spasm: Secondary | ICD-10-CM | POA: Diagnosis not present

## 2023-09-20 ENCOUNTER — Other Ambulatory Visit: Payer: Self-pay | Admitting: Internal Medicine

## 2023-09-20 DIAGNOSIS — Z1231 Encounter for screening mammogram for malignant neoplasm of breast: Secondary | ICD-10-CM

## 2023-09-24 DIAGNOSIS — M542 Cervicalgia: Secondary | ICD-10-CM | POA: Diagnosis not present

## 2023-09-24 DIAGNOSIS — E875 Hyperkalemia: Secondary | ICD-10-CM | POA: Diagnosis not present

## 2023-09-24 DIAGNOSIS — I10 Essential (primary) hypertension: Secondary | ICD-10-CM | POA: Diagnosis not present

## 2023-09-24 DIAGNOSIS — M797 Fibromyalgia: Secondary | ICD-10-CM | POA: Diagnosis not present

## 2023-09-24 DIAGNOSIS — G43909 Migraine, unspecified, not intractable, without status migrainosus: Secondary | ICD-10-CM | POA: Diagnosis not present

## 2023-09-24 DIAGNOSIS — G713 Mitochondrial myopathy, not elsewhere classified: Secondary | ICD-10-CM | POA: Diagnosis not present

## 2023-09-24 DIAGNOSIS — M898X9 Other specified disorders of bone, unspecified site: Secondary | ICD-10-CM | POA: Diagnosis not present

## 2023-09-24 DIAGNOSIS — K3184 Gastroparesis: Secondary | ICD-10-CM | POA: Diagnosis not present

## 2023-10-01 DIAGNOSIS — E210 Primary hyperparathyroidism: Secondary | ICD-10-CM | POA: Diagnosis not present

## 2023-10-25 ENCOUNTER — Ambulatory Visit
Admission: RE | Admit: 2023-10-25 | Discharge: 2023-10-25 | Disposition: A | Discharge status: Home / Self Care | Source: Ambulatory Visit | Attending: Internal Medicine | Admitting: Internal Medicine

## 2023-10-25 DIAGNOSIS — N1831 Chronic kidney disease, stage 3a: Secondary | ICD-10-CM | POA: Diagnosis not present

## 2023-10-25 DIAGNOSIS — R110 Nausea: Secondary | ICD-10-CM | POA: Diagnosis not present

## 2023-10-25 DIAGNOSIS — Z1231 Encounter for screening mammogram for malignant neoplasm of breast: Secondary | ICD-10-CM | POA: Insufficient documentation

## 2023-10-25 DIAGNOSIS — R7989 Other specified abnormal findings of blood chemistry: Secondary | ICD-10-CM | POA: Diagnosis not present

## 2023-10-25 DIAGNOSIS — K3184 Gastroparesis: Secondary | ICD-10-CM | POA: Diagnosis not present

## 2023-10-25 DIAGNOSIS — M797 Fibromyalgia: Secondary | ICD-10-CM | POA: Diagnosis not present

## 2023-10-25 DIAGNOSIS — I10 Essential (primary) hypertension: Secondary | ICD-10-CM | POA: Diagnosis not present

## 2023-10-25 DIAGNOSIS — N390 Urinary tract infection, site not specified: Secondary | ICD-10-CM | POA: Diagnosis not present

## 2023-10-25 DIAGNOSIS — E213 Hyperparathyroidism, unspecified: Secondary | ICD-10-CM | POA: Diagnosis not present

## 2023-10-25 DIAGNOSIS — E610 Copper deficiency: Secondary | ICD-10-CM | POA: Diagnosis not present

## 2023-10-25 DIAGNOSIS — M5412 Radiculopathy, cervical region: Secondary | ICD-10-CM | POA: Diagnosis not present

## 2023-11-05 DIAGNOSIS — N289 Disorder of kidney and ureter, unspecified: Secondary | ICD-10-CM | POA: Diagnosis not present

## 2023-11-05 DIAGNOSIS — E213 Hyperparathyroidism, unspecified: Secondary | ICD-10-CM | POA: Diagnosis not present

## 2023-11-05 DIAGNOSIS — M898X9 Other specified disorders of bone, unspecified site: Secondary | ICD-10-CM | POA: Diagnosis not present

## 2023-11-05 DIAGNOSIS — E039 Hypothyroidism, unspecified: Secondary | ICD-10-CM | POA: Diagnosis not present

## 2023-11-05 DIAGNOSIS — Z0000 Encounter for general adult medical examination without abnormal findings: Secondary | ICD-10-CM | POA: Diagnosis not present

## 2023-11-05 DIAGNOSIS — I10 Essential (primary) hypertension: Secondary | ICD-10-CM | POA: Diagnosis not present

## 2023-11-14 DIAGNOSIS — G713 Mitochondrial myopathy, not elsewhere classified: Secondary | ICD-10-CM | POA: Diagnosis not present

## 2023-11-14 DIAGNOSIS — H5111 Convergence insufficiency: Secondary | ICD-10-CM | POA: Diagnosis not present

## 2023-11-14 DIAGNOSIS — H02423 Myogenic ptosis of bilateral eyelids: Secondary | ICD-10-CM | POA: Diagnosis not present

## 2023-11-14 DIAGNOSIS — H532 Diplopia: Secondary | ICD-10-CM | POA: Diagnosis not present

## 2023-11-22 DIAGNOSIS — G2581 Restless legs syndrome: Secondary | ICD-10-CM | POA: Diagnosis not present

## 2023-11-22 DIAGNOSIS — G4733 Obstructive sleep apnea (adult) (pediatric): Secondary | ICD-10-CM | POA: Diagnosis not present

## 2023-11-22 DIAGNOSIS — E039 Hypothyroidism, unspecified: Secondary | ICD-10-CM | POA: Diagnosis not present

## 2023-11-22 DIAGNOSIS — R9431 Abnormal electrocardiogram [ECG] [EKG]: Secondary | ICD-10-CM | POA: Diagnosis not present

## 2023-11-22 DIAGNOSIS — I10 Essential (primary) hypertension: Secondary | ICD-10-CM | POA: Diagnosis not present

## 2023-11-22 DIAGNOSIS — N183 Chronic kidney disease, stage 3 unspecified: Secondary | ICD-10-CM | POA: Diagnosis not present

## 2023-11-22 DIAGNOSIS — E213 Hyperparathyroidism, unspecified: Secondary | ICD-10-CM | POA: Diagnosis not present

## 2023-11-22 DIAGNOSIS — Z79899 Other long term (current) drug therapy: Secondary | ICD-10-CM | POA: Diagnosis not present

## 2023-11-22 DIAGNOSIS — G713 Mitochondrial myopathy, not elsewhere classified: Secondary | ICD-10-CM | POA: Diagnosis not present

## 2023-11-23 DIAGNOSIS — E213 Hyperparathyroidism, unspecified: Secondary | ICD-10-CM | POA: Diagnosis not present

## 2023-11-23 DIAGNOSIS — N183 Chronic kidney disease, stage 3 unspecified: Secondary | ICD-10-CM | POA: Diagnosis not present

## 2023-11-23 DIAGNOSIS — R7303 Prediabetes: Secondary | ICD-10-CM | POA: Diagnosis not present

## 2023-11-23 DIAGNOSIS — G713 Mitochondrial myopathy, not elsewhere classified: Secondary | ICD-10-CM | POA: Diagnosis not present

## 2023-11-23 DIAGNOSIS — K3184 Gastroparesis: Secondary | ICD-10-CM | POA: Diagnosis not present

## 2023-11-28 DIAGNOSIS — R9431 Abnormal electrocardiogram [ECG] [EKG]: Secondary | ICD-10-CM | POA: Diagnosis not present

## 2023-11-28 DIAGNOSIS — E039 Hypothyroidism, unspecified: Secondary | ICD-10-CM | POA: Diagnosis not present

## 2023-11-28 DIAGNOSIS — I10 Essential (primary) hypertension: Secondary | ICD-10-CM | POA: Diagnosis not present

## 2023-11-28 DIAGNOSIS — E213 Hyperparathyroidism, unspecified: Secondary | ICD-10-CM | POA: Diagnosis not present

## 2023-11-28 DIAGNOSIS — G4733 Obstructive sleep apnea (adult) (pediatric): Secondary | ICD-10-CM | POA: Diagnosis not present

## 2023-12-04 DIAGNOSIS — G4733 Obstructive sleep apnea (adult) (pediatric): Secondary | ICD-10-CM | POA: Diagnosis not present

## 2023-12-04 DIAGNOSIS — G992 Myelopathy in diseases classified elsewhere: Secondary | ICD-10-CM | POA: Diagnosis not present

## 2023-12-04 DIAGNOSIS — J4489 Other specified chronic obstructive pulmonary disease: Secondary | ICD-10-CM | POA: Diagnosis not present

## 2023-12-04 DIAGNOSIS — J9611 Chronic respiratory failure with hypoxia: Secondary | ICD-10-CM | POA: Diagnosis not present

## 2023-12-04 DIAGNOSIS — M810 Age-related osteoporosis without current pathological fracture: Secondary | ICD-10-CM | POA: Diagnosis not present

## 2023-12-04 DIAGNOSIS — G713 Mitochondrial myopathy, not elsewhere classified: Secondary | ICD-10-CM | POA: Diagnosis not present

## 2023-12-04 DIAGNOSIS — M4802 Spinal stenosis, cervical region: Secondary | ICD-10-CM | POA: Diagnosis not present

## 2023-12-04 DIAGNOSIS — I129 Hypertensive chronic kidney disease with stage 1 through stage 4 chronic kidney disease, or unspecified chronic kidney disease: Secondary | ICD-10-CM | POA: Diagnosis not present

## 2023-12-04 DIAGNOSIS — E1122 Type 2 diabetes mellitus with diabetic chronic kidney disease: Secondary | ICD-10-CM | POA: Diagnosis not present

## 2023-12-04 DIAGNOSIS — N183 Chronic kidney disease, stage 3 unspecified: Secondary | ICD-10-CM | POA: Diagnosis not present

## 2023-12-04 DIAGNOSIS — E782 Mixed hyperlipidemia: Secondary | ICD-10-CM | POA: Diagnosis not present

## 2023-12-04 DIAGNOSIS — I2510 Atherosclerotic heart disease of native coronary artery without angina pectoris: Secondary | ICD-10-CM | POA: Diagnosis not present

## 2023-12-04 DIAGNOSIS — D351 Benign neoplasm of parathyroid gland: Secondary | ICD-10-CM | POA: Diagnosis not present

## 2023-12-04 DIAGNOSIS — E210 Primary hyperparathyroidism: Secondary | ICD-10-CM | POA: Diagnosis not present

## 2023-12-24 DIAGNOSIS — Z9889 Other specified postprocedural states: Secondary | ICD-10-CM | POA: Diagnosis not present

## 2023-12-24 DIAGNOSIS — E039 Hypothyroidism, unspecified: Secondary | ICD-10-CM | POA: Diagnosis not present

## 2023-12-24 DIAGNOSIS — Z9089 Acquired absence of other organs: Secondary | ICD-10-CM | POA: Diagnosis not present

## 2024-01-04 DIAGNOSIS — E673 Hypervitaminosis D: Secondary | ICD-10-CM | POA: Diagnosis not present

## 2024-01-04 DIAGNOSIS — Z9889 Other specified postprocedural states: Secondary | ICD-10-CM | POA: Diagnosis not present

## 2024-01-04 DIAGNOSIS — Z9089 Acquired absence of other organs: Secondary | ICD-10-CM | POA: Diagnosis not present

## 2024-01-04 DIAGNOSIS — E213 Hyperparathyroidism, unspecified: Secondary | ICD-10-CM | POA: Diagnosis not present

## 2024-01-04 DIAGNOSIS — M818 Other osteoporosis without current pathological fracture: Secondary | ICD-10-CM | POA: Diagnosis not present

## 2024-01-15 DIAGNOSIS — E673 Hypervitaminosis D: Secondary | ICD-10-CM | POA: Diagnosis not present

## 2024-01-15 DIAGNOSIS — Z9089 Acquired absence of other organs: Secondary | ICD-10-CM | POA: Diagnosis not present

## 2024-01-15 DIAGNOSIS — Z9889 Other specified postprocedural states: Secondary | ICD-10-CM | POA: Diagnosis not present

## 2024-01-15 DIAGNOSIS — M818 Other osteoporosis without current pathological fracture: Secondary | ICD-10-CM | POA: Diagnosis not present

## 2024-01-23 DIAGNOSIS — Z9089 Acquired absence of other organs: Secondary | ICD-10-CM | POA: Diagnosis not present

## 2024-01-23 DIAGNOSIS — M818 Other osteoporosis without current pathological fracture: Secondary | ICD-10-CM | POA: Diagnosis not present

## 2024-01-23 DIAGNOSIS — Z9889 Other specified postprocedural states: Secondary | ICD-10-CM | POA: Diagnosis not present

## 2024-02-01 DIAGNOSIS — M810 Age-related osteoporosis without current pathological fracture: Secondary | ICD-10-CM | POA: Diagnosis not present

## 2024-02-01 DIAGNOSIS — E610 Copper deficiency: Secondary | ICD-10-CM | POA: Diagnosis not present

## 2024-02-01 DIAGNOSIS — G713 Mitochondrial myopathy, not elsewhere classified: Secondary | ICD-10-CM | POA: Diagnosis not present

## 2024-02-01 DIAGNOSIS — R531 Weakness: Secondary | ICD-10-CM | POA: Diagnosis not present

## 2024-02-01 DIAGNOSIS — K3184 Gastroparesis: Secondary | ICD-10-CM | POA: Diagnosis not present

## 2024-02-01 DIAGNOSIS — G4733 Obstructive sleep apnea (adult) (pediatric): Secondary | ICD-10-CM | POA: Diagnosis not present

## 2024-02-01 DIAGNOSIS — H499 Unspecified paralytic strabismus: Secondary | ICD-10-CM | POA: Diagnosis not present

## 2024-02-01 DIAGNOSIS — E213 Hyperparathyroidism, unspecified: Secondary | ICD-10-CM | POA: Diagnosis not present

## 2024-02-01 DIAGNOSIS — E1143 Type 2 diabetes mellitus with diabetic autonomic (poly)neuropathy: Secondary | ICD-10-CM | POA: Diagnosis not present

## 2024-02-12 DIAGNOSIS — I10 Essential (primary) hypertension: Secondary | ICD-10-CM | POA: Diagnosis not present

## 2024-02-12 DIAGNOSIS — R635 Abnormal weight gain: Secondary | ICD-10-CM | POA: Diagnosis not present

## 2024-02-12 DIAGNOSIS — E039 Hypothyroidism, unspecified: Secondary | ICD-10-CM | POA: Diagnosis not present

## 2024-02-12 DIAGNOSIS — G4733 Obstructive sleep apnea (adult) (pediatric): Secondary | ICD-10-CM | POA: Diagnosis not present

## 2024-02-12 DIAGNOSIS — M797 Fibromyalgia: Secondary | ICD-10-CM | POA: Diagnosis not present

## 2024-02-12 DIAGNOSIS — E875 Hyperkalemia: Secondary | ICD-10-CM | POA: Diagnosis not present

## 2024-02-12 DIAGNOSIS — G43909 Migraine, unspecified, not intractable, without status migrainosus: Secondary | ICD-10-CM | POA: Diagnosis not present

## 2024-02-12 DIAGNOSIS — N289 Disorder of kidney and ureter, unspecified: Secondary | ICD-10-CM | POA: Diagnosis not present

## 2024-02-12 DIAGNOSIS — E213 Hyperparathyroidism, unspecified: Secondary | ICD-10-CM | POA: Diagnosis not present

## 2024-02-12 DIAGNOSIS — G713 Mitochondrial myopathy, not elsewhere classified: Secondary | ICD-10-CM | POA: Diagnosis not present

## 2024-02-12 DIAGNOSIS — F32A Depression, unspecified: Secondary | ICD-10-CM | POA: Diagnosis not present

## 2024-02-20 DIAGNOSIS — R8281 Pyuria: Secondary | ICD-10-CM | POA: Diagnosis not present

## 2024-02-20 DIAGNOSIS — N1832 Chronic kidney disease, stage 3b: Secondary | ICD-10-CM | POA: Diagnosis not present

## 2024-02-20 DIAGNOSIS — N189 Chronic kidney disease, unspecified: Secondary | ICD-10-CM | POA: Diagnosis not present

## 2024-02-20 DIAGNOSIS — K219 Gastro-esophageal reflux disease without esophagitis: Secondary | ICD-10-CM | POA: Diagnosis not present

## 2024-02-20 DIAGNOSIS — R339 Retention of urine, unspecified: Secondary | ICD-10-CM | POA: Diagnosis not present

## 2024-02-20 DIAGNOSIS — Z87440 Personal history of urinary (tract) infections: Secondary | ICD-10-CM | POA: Diagnosis not present

## 2024-02-20 DIAGNOSIS — I10 Essential (primary) hypertension: Secondary | ICD-10-CM | POA: Diagnosis not present

## 2024-02-20 DIAGNOSIS — N179 Acute kidney failure, unspecified: Secondary | ICD-10-CM | POA: Diagnosis not present

## 2024-02-29 DIAGNOSIS — H532 Diplopia: Secondary | ICD-10-CM | POA: Diagnosis not present

## 2024-02-29 DIAGNOSIS — H49813 Kearns-Sayre syndrome, bilateral: Secondary | ICD-10-CM | POA: Diagnosis not present

## 2024-02-29 DIAGNOSIS — H2513 Age-related nuclear cataract, bilateral: Secondary | ICD-10-CM | POA: Diagnosis not present

## 2024-03-12 DIAGNOSIS — G713 Mitochondrial myopathy, not elsewhere classified: Secondary | ICD-10-CM | POA: Diagnosis not present

## 2024-03-12 DIAGNOSIS — G894 Chronic pain syndrome: Secondary | ICD-10-CM | POA: Diagnosis not present

## 2024-03-12 DIAGNOSIS — M797 Fibromyalgia: Secondary | ICD-10-CM | POA: Diagnosis not present

## 2024-03-17 DIAGNOSIS — M818 Other osteoporosis without current pathological fracture: Secondary | ICD-10-CM | POA: Diagnosis not present

## 2024-03-17 DIAGNOSIS — E039 Hypothyroidism, unspecified: Secondary | ICD-10-CM | POA: Diagnosis not present

## 2024-03-17 DIAGNOSIS — E892 Postprocedural hypoparathyroidism: Secondary | ICD-10-CM | POA: Diagnosis not present

## 2024-03-17 DIAGNOSIS — Z9089 Acquired absence of other organs: Secondary | ICD-10-CM | POA: Diagnosis not present

## 2024-03-18 DIAGNOSIS — N390 Urinary tract infection, site not specified: Secondary | ICD-10-CM | POA: Diagnosis not present

## 2024-03-18 DIAGNOSIS — Z9089 Acquired absence of other organs: Secondary | ICD-10-CM | POA: Diagnosis not present

## 2024-03-18 DIAGNOSIS — E039 Hypothyroidism, unspecified: Secondary | ICD-10-CM | POA: Diagnosis not present

## 2024-03-18 DIAGNOSIS — R8290 Unspecified abnormal findings in urine: Secondary | ICD-10-CM | POA: Diagnosis not present

## 2024-03-18 DIAGNOSIS — M818 Other osteoporosis without current pathological fracture: Secondary | ICD-10-CM | POA: Diagnosis not present

## 2024-03-18 DIAGNOSIS — Z9889 Other specified postprocedural states: Secondary | ICD-10-CM | POA: Diagnosis not present
